# Patient Record
Sex: Female | Born: 1995 | Race: Black or African American | Hispanic: No | Marital: Single | Smoking: Never smoker
Health system: Southern US, Community
[De-identification: ages and names within clinical notes are randomized; demographics above are authoritative.]

## PROBLEM LIST (undated history)

## (undated) DIAGNOSIS — J45909 Unspecified asthma, uncomplicated: Secondary | ICD-10-CM

## (undated) DIAGNOSIS — G43019 Migraine without aura, intractable, without status migrainosus: Secondary | ICD-10-CM

## (undated) HISTORY — DX: Migraine without aura, intractable, without status migrainosus: G43.019

---

## 2015-05-01 ENCOUNTER — Emergency Department (HOSPITAL_COMMUNITY)
Admission: EM | Admit: 2015-05-01 | Discharge: 2015-05-01 | Disposition: A | Discharge status: Home / Self Care | Payer: Self-pay | Attending: Emergency Medicine | Admitting: Emergency Medicine

## 2015-05-01 ENCOUNTER — Emergency Department (HOSPITAL_COMMUNITY)

## 2015-05-01 ENCOUNTER — Encounter (HOSPITAL_COMMUNITY): Payer: Self-pay | Admitting: *Deleted

## 2015-05-01 ENCOUNTER — Encounter (HOSPITAL_COMMUNITY): Payer: Self-pay | Admitting: Emergency Medicine

## 2015-05-01 DIAGNOSIS — J45909 Unspecified asthma, uncomplicated: Secondary | ICD-10-CM

## 2015-05-01 DIAGNOSIS — J45901 Unspecified asthma with (acute) exacerbation: Secondary | ICD-10-CM | POA: Insufficient documentation

## 2015-05-01 DIAGNOSIS — R05 Cough: Secondary | ICD-10-CM

## 2015-05-01 DIAGNOSIS — R51 Headache: Secondary | ICD-10-CM | POA: Insufficient documentation

## 2015-05-01 DIAGNOSIS — R0981 Nasal congestion: Secondary | ICD-10-CM | POA: Insufficient documentation

## 2015-05-01 DIAGNOSIS — R059 Cough, unspecified: Secondary | ICD-10-CM

## 2015-05-01 DIAGNOSIS — Z79899 Other long term (current) drug therapy: Secondary | ICD-10-CM | POA: Insufficient documentation

## 2015-05-01 DIAGNOSIS — R079 Chest pain, unspecified: Secondary | ICD-10-CM | POA: Insufficient documentation

## 2015-05-01 HISTORY — DX: Unspecified asthma, uncomplicated: J45.909

## 2015-05-01 MED ORDER — IPRATROPIUM-ALBUTEROL 0.5-2.5 (3) MG/3ML IN SOLN
3.0000 mL | RESPIRATORY_TRACT | Status: DC
  Administered 2015-05-01: 3 mL via RESPIRATORY_TRACT
  Filled 2015-05-01: qty 3

## 2015-05-01 MED ORDER — PREDNISONE 20 MG PO TABS: 40.0000 mg | ORAL_TABLET | Freq: Every day | ORAL | Status: DC

## 2015-05-01 MED ORDER — DM-GUAIFENESIN ER 30-600 MG PO TB12: 1.0000 | ORAL_TABLET | Freq: Two times a day (BID) | ORAL | Status: DC

## 2015-05-01 MED ORDER — ALBUTEROL SULFATE HFA 108 (90 BASE) MCG/ACT IN AERS
2.0000 | INHALATION_SPRAY | Freq: Once | RESPIRATORY_TRACT | Status: AC
  Administered 2015-05-01: 2 via RESPIRATORY_TRACT
  Filled 2015-05-01: qty 6.7

## 2015-05-01 MED ORDER — PREDNISONE 20 MG PO TABS
60.0000 mg | ORAL_TABLET | Freq: Once | ORAL | Status: AC
  Administered 2015-05-01: 60 mg via ORAL
  Filled 2015-05-01: qty 3

## 2015-05-01 MED ORDER — ALBUTEROL SULFATE (2.5 MG/3ML) 0.083% IN NEBU
5.0000 mg | INHALATION_SOLUTION | Freq: Once | RESPIRATORY_TRACT | Status: AC
  Administered 2015-05-01: 5 mg via RESPIRATORY_TRACT
  Filled 2015-05-01: qty 6

## 2015-05-01 MED ORDER — IPRATROPIUM BROMIDE 0.02 % IN SOLN
0.5000 mg | Freq: Once | RESPIRATORY_TRACT | Status: AC
  Administered 2015-05-01: 0.5 mg via RESPIRATORY_TRACT
  Filled 2015-05-01: qty 2.5

## 2015-05-01 NOTE — ED Notes (Signed)
Pt done with breathing treatment 

## 2015-05-01 NOTE — ED Provider Notes (Signed)
CSN: [id number]     Arrival date & time 05/01/15  1002 History  This chart was scribed for non-physician practitioner, Joycie Peek, PA-C  working with Margarita Grizzle, MD, by Jarvis Morgan, ED Scribe. This patient was seen in room TR06C/TR06C and the patient's care was started at 10:27 AM.     Chief Complaint  Patient presents with  . Asthma  . Chest Pain  . Cough    The history is provided by the patient. No language interpreter was used.    HPI Comments: Jennifer Vance is a 19 y.o. female with a h/o asthma who presents to the Emergency Department complaining of intermittent, moderate, dry cough  onset 3 days. She reports associated chest tightness, centralized chest wall pain and nasal congestion. Pt has a h/o asthma and notes she had an asthma attack this morning for which she used 4 puffs of her inhaler with mild relief. Her chest pain is exacerbated with coughing. She denies any h/o PE. Pt is not currently on BC pills. She denies any hemoptysis, leg swelling, fever or chills. She states her symptoms today are consistent with her typical asthma exacerbation symptoms.  Past Medical History  Diagnosis Date  . Asthma    History reviewed. No pertinent past surgical history. No family history on file. Social History  Substance Use Topics  . Smoking status: Never Smoker   . Smokeless tobacco: None  . Alcohol Use: No   OB History    No data available     Review of Systems  Constitutional: Negative for fever and chills.  HENT: Positive for congestion.   Respiratory: Positive for cough.   Cardiovascular: Positive for chest pain (chest wall pain). Negative for leg swelling.  All other systems reviewed and are negative.     Allergies  Review of patient's allergies indicates no known allergies.  Home Medications   Prior to Admission medications   Medication Sig Start Date End Date Taking? Authorizing Provider  dextromethorphan-guaiFENesin (MUCINEX DM) 30-600 MG per 12 hr  tablet Take 1 tablet by mouth 2 (two) times daily. 05/01/15   Joycie Peek, PA-C   Triage Vitals: BP 120/74 mmHg  Pulse 76  Temp(Src) 98.2 F (36.8 C) (Oral)  Resp 16  Ht  (1.753 m)  Wt 130 lb (58.968 kg)  BMI 19.19 kg/m2  SpO2 100%  LMP 04/28/2015 (Exact Date)  Physical Exam  Constitutional: She is oriented to person, place, and time. She appears well-developed and well-nourished. No distress.  HENT:  Head: Normocephalic and atraumatic.  Eyes: Conjunctivae and EOM are normal. Pupils are equal, round, and reactive to light.  Neck: Neck supple. No tracheal deviation present.  Cardiovascular: Normal rate, regular rhythm and normal heart sounds.   Pulmonary/Chest: Effort normal. No respiratory distress. She has wheezes (mild to left upper lobe).  Musculoskeletal: Normal range of motion.  Neurological: She is alert and oriented to person, place, and time.  Skin: Skin is warm and dry.  Psychiatric: She has a normal mood and affect. Her behavior is normal.  Nursing note and vitals reviewed.   ED Course  Procedures (including critical care time)  DIAGNOSTIC STUDIES: Oxygen Saturation is 100% on RA, normal by my interpretation.    COORDINATION OF CARE: 10:39 AM- Pt advised of plan for treatment and pt agrees. Will order breathing treatment.     Labs Review Labs Reviewed - No data to display  Imaging Review No results found. I have personally reviewed and evaluated these images and lab  results as part of my medical decision-making.   EKG Interpretation None     Meds given in ED:  Medications  albuterol (PROVENTIL HFA;VENTOLIN HFA) 108 (90 BASE) MCG/ACT inhaler 2 puff (2 puffs Inhalation Given 05/01/15 1048)    Discharge Medication List as of 05/01/2015 11:33 AM    START taking these medications   Details  dextromethorphan-guaiFENesin (MUCINEX DM) 30-600 MG per 12 hr tablet Take 1 tablet by mouth 2 (two) times daily., Starting 05/01/2015, Until Discontinued, Print        Filed Vitals:   05/01/15 1012  BP: 120/74  Pulse: 76  Temp: 98.2 F (36.8 C)  TempSrc: Oral  Resp: 16  Height: 5\' 9"  (1.753 m)  Weight: 130 lb (58.968 kg)  SpO2: 100%    MDM  Vitals stable - WNL -afebrile Pt resting comfortably in ED. PE--very mild left upper lobe wheezing prior to [facility] treatment. Symptoms resolved after breathing treatment. Symptoms experienced today are similar to typical asthma exacerbation. No evidence of other acute or emergent pathology. Low suspicion for PE, pneumonia or other cardiopulmonary pathology. Patient is PERC negative. Given Mucinex for cough symptoms. Also given inhaler as patient states she is out of her one at home.  I discussed all relevant lab findings and imaging results with pt and they verbalized understanding. Discussed f/u with PCP within 48 hrs and return precautions, pt very amenable to plan.  Final diagnoses:  Asthma, unspecified asthma severity, uncomplicated  Cough   I personally performed the services described in this documentation, which was scribed in my presence. The recorded information has been reviewed and is accurate.     Joycie Peek, PA-C 05/01/15 1512  Margarita Grizzle, MD 05/02/15 [phone]

## 2015-05-01 NOTE — ED Notes (Signed)
Pt reports having recent cough and chest pain. Pain occurs more when breathing. Pt was here earlier and dc after a breathing tx. No acute distress noted at triage.

## 2015-05-01 NOTE — ED Notes (Signed)
Xray came for pickup.  Pt currently doing breathing treatment.  Will return.

## 2015-05-01 NOTE — Discharge Instructions (Signed)
1. Medications: prednisone, albuterol inhaler, mucinex usual home medications 2. Treatment: rest, drink plenty of fluids  3. Follow Up: please followup with your primary doctor for discussion of your diagnoses and further evaluation after today's visit; if you do not have a primary care doctor use the resource guide provided to find one; please return to the ER for severe chest pain or shortness of breath, fever, chills, new or worsening symptoms   Chest Pain (Nonspecific) It is often hard to give a specific diagnosis for the cause of chest pain. There is always a chance that your pain could be related to something serious, such as a heart attack or a blood clot in the lungs. You need to follow up with your health care provider for further evaluation. CAUSES   Heartburn.  Pneumonia or bronchitis.  Anxiety or stress.  Inflammation around your heart (pericarditis) or lung (pleuritis or pleurisy).  A blood clot in the lung.  A collapsed lung (pneumothorax). It can develop suddenly on its own (spontaneous pneumothorax) or from trauma to the chest.  Shingles infection (herpes zoster virus). The chest wall is composed of bones, muscles, and cartilage. Any of these can be the source of the pain.  The bones can be bruised by injury.  The muscles or cartilage can be strained by coughing or overwork.  The cartilage can be affected by inflammation and become sore (costochondritis). DIAGNOSIS  Lab tests or other studies may be needed to find the cause of your pain. Your health care provider may have you take a test called an ambulatory electrocardiogram (ECG). An ECG records your heartbeat patterns over a 24-hour period. You may also have other tests, such as:  Transthoracic echocardiogram (TTE). During echocardiography, sound waves are used to evaluate how blood flows through your heart.  Transesophageal echocardiogram (TEE).  Cardiac monitoring. This allows your health care provider to  monitor your heart rate and rhythm in real time.  Holter monitor. This is a portable device that records your heartbeat and can help diagnose heart arrhythmias. It allows your health care provider to track your heart activity for several days, if needed.  Stress tests by exercise or by giving medicine that makes the heart beat faster. TREATMENT   Treatment depends on what may be causing your chest pain. Treatment may include:  Acid blockers for heartburn.  Anti-inflammatory medicine.  Pain medicine for inflammatory conditions.  Antibiotics if an infection is present.  You may be advised to change lifestyle habits. This includes stopping smoking and avoiding alcohol, caffeine, and chocolate.  You may be advised to keep your head raised (elevated) when sleeping. This reduces the chance of acid going backward from your stomach into your esophagus. Most of the time, nonspecific chest pain will improve within 2-3 days with rest and mild pain medicine.  HOME CARE INSTRUCTIONS   If antibiotics were prescribed, take them as directed. Finish them even if you start to feel better.  For the next few days, avoid physical activities that bring on chest pain. Continue physical activities as directed.  Do not use any tobacco products, including cigarettes, chewing tobacco, or electronic cigarettes.  Avoid drinking alcohol.  Only take medicine as directed by your health care provider.  Follow your health care provider's suggestions for further testing if your chest pain does not go away.  Keep any follow-up appointments you made. If you do not go to an appointment, you could develop lasting (chronic) problems with pain. If there is any problem keeping  an appointment, call to reschedule. SEEK MEDICAL CARE IF:   Your chest pain does not go away, even after treatment.  You have a rash with blisters on your chest.  You have a fever. SEEK IMMEDIATE MEDICAL CARE IF:   You have increased chest  pain or pain that spreads to your arm, neck, jaw, back, or abdomen.  You have shortness of breath.  You have an increasing cough, or you cough up blood.  You have severe back or abdominal pain.  You feel nauseous or vomit.  You have severe weakness.  You faint.  You have chills. This is an emergency. Do not wait to see if the pain will go away. Get medical help at once. Call your local emergency services (911 in U.S.). Do not drive yourself to the hospital. MAKE SURE YOU:   Understand these instructions.  Will watch your condition.  Will get help right away if you are not doing well or get worse. Document Released: 05/25/2005 Document Revised: 08/20/2013 Document Reviewed: 03/20/2008 [facility] Patient Information 2015 [place], [place]. This information is not intended to replace advice given to you by your health care provider. Make sure you discuss any questions you have with your health care provider.  Cough, Adult  A cough is a reflex. It helps you clear your throat and airways. A cough can help heal your body. A cough can last 2 or 3 weeks (acute) or may last more than 8 weeks (chronic). Some common causes of a cough can include an infection, allergy, or a cold. HOME CARE  Only take medicine as told by your doctor.  If given, take your medicines (antibiotics) as told. Finish them even if you start to feel better.  Use a cold steam vaporizer or humidifier in your home. This can help loosen thick spit (secretions).  Sleep so you are almost sitting up (semi-upright). Use pillows to do this. This helps reduce coughing.  Rest as needed.  Stop smoking if you smoke. GET HELP RIGHT AWAY IF:  You have yellowish-white fluid (pus) in your thick spit.  Your cough gets worse.  Your medicine does not reduce coughing, and you are losing sleep.  You cough up blood.  You have trouble breathing.  Your pain gets worse and medicine does not help.  You have a fever. MAKE SURE  YOU:   Understand these instructions.  Will watch your condition.  Will get help right away if you are not doing well or get worse. Document Released: 04/28/2011 Document Revised: 12/30/2013 Document Reviewed: 04/28/2011 [facility] Patient Information 2015 [place], [place]. This information is not intended to replace advice given to you by your health care provider. Make sure you discuss any questions you have with your health care provider.  Upper Respiratory Infection, Adult An upper respiratory infection (URI) is also known as the common cold. It is often caused by a type of germ (virus). Colds are easily spread (contagious). You can pass it to others by kissing, coughing, sneezing, or drinking out of the same glass. Usually, you get better in 1 or 2 weeks.  HOME CARE   Only take medicine as told by your doctor.  Use a warm mist humidifier or breathe in steam from a hot shower.  Drink enough water and fluids to keep your pee (urine) clear or pale yellow.  Get plenty of rest.  Return to work when your temperature is back to normal or as told by your doctor. You may use a face mask and  wash your hands to stop your cold from spreading. GET HELP RIGHT AWAY IF:   After the first few days, you feel you are getting worse.  You have questions about your medicine.  You have chills, shortness of breath, or brown or red spit (mucus).  You have yellow or brown snot (nasal discharge) or pain in the face, especially when you bend forward.  You have a fever, puffy (swollen) neck, pain when you swallow, or white spots in the back of your throat.  You have a bad headache, ear pain, sinus pain, or chest pain.  You have a high-pitched whistling sound when you breathe in and out (wheezing).  You have a lasting cough or cough up blood.  You have sore muscles or a stiff neck. MAKE SURE YOU:   Understand these instructions.  Will watch your condition.  Will get help right away if you are not  doing well or get worse. Document Released: 02/01/2008 Document Revised: 11/07/2011 Document Reviewed: 11/20/2013 [facility] Patient Information 2015 [place], [place]. This information is not intended to replace advice given to you by your health care provider. Make sure you discuss any questions you have with your health care provider.   Emergency Department Resource Guide 1) Find a Doctor and Pay Out of Pocket Although you won't have to find out who is covered by your insurance plan, it is a good idea to ask around and get recommendations. You will then need to call the office and see if the doctor you have chosen will accept you as a new patient and what types of options they offer for patients who are self-pay. Some doctors offer discounts or will set up payment plans for their patients who do not have insurance, but you will need to ask so you aren't surprised when you get to your appointment.  2) Contact Your Local Health Department Not all health departments have doctors that can see patients for sick visits, but many do, so it is worth a call to see if yours does. If you don't know where your local health department is, you can check in your phone book. The CDC also has a tool to help you locate your state's health department, and many state websites also have listings of all of their local health departments.  3) Find a Walk-in Clinic If your illness is not likely to be very severe or complicated, you may want to try a walk in clinic. These are popping up all over the country in pharmacies, drugstores, and shopping centers. They're usually staffed by nurse practitioners or physician assistants that have been trained to treat common illnesses and complaints. They're usually fairly quick and inexpensive. However, if you have serious medical issues or chronic medical problems, these are probably not your best option.  No Primary Care Doctor: - Call Health Connect at  [phone] - they can help you  locate a primary care doctor that  accepts your insurance, provides certain services, etc. - Physician Referral Service- [phone]  Chronic Pain Problems: Organization         Address  Phone   Notes  [facility]  [phone] Patients need to be referred by their primary care doctor.   Medication Assistance: Organization         Address  Phone   Notes  [facility] Medication [facility] [address] [place] [place], [place] [address] [phone] --Must be a resident of [facility] -- Must have NO  insurance coverage whatsoever (no Medicaid/ Medicare, etc.) -- The pt. MUST have a primary care doctor that directs their care regularly and follows them in the community   [facility]ist  [phone]   [employer]  [phone]    Agencies that provide inexpensive medical care: Organization         Address  Phone   Notes  [facility]  [phone]   [facility]    [phone]   [facility] [address] [place], [place] [address] [phone]   [facility] [address] [place], [place] [phone]   Planned Parenthood    [phone]   [facility]    [phone]   [facility] and [facility]  [address], [place] Phone:  [phone], Fax:  [phone] Hours of Operation:  9 am - 6 pm, M-F.  Also accepts Medicaid/Medicare and self-pay.  [facility]  [address], [place] Phone: [phone], Fax: [phone]. Hours of Operation:  8:30 am - 5:30 pm, M-F.  Also accepts Medicaid and self-pay.  [facility] [place] [address], [place] Phone: [phone]   [facility] [address] Natasha Bence [place], [place] [phone], Ext. [phone] Mondays & Thursdays: 7-9 AM.  First 15 patients are seen on a first come, first serve basis.    Medicaid-accepting [facility]  Providers:  Organization         Address  Phone   Notes  [facility] [address], [place] [phone] Also accepts self-pay patients.  [facility] [address] Laurell Josephs [place], [place]  [phone]   [facility] [address], [place] [phone]   [facility] [address], [place] [phone]   Renaye Rakers [address], [place]   [phone] Only accepts [place] Access [place] patients after they have their name applied to their card.   Self-Pay (no insurance) in [facility]:  Organization         Address  Phone   Notes  Sickle Cell Patients, [facility] [address] [place], [place] [phone]   [facility] [address] [place], [place] [phone]   [facility] [place]  [address], [place] [phone]   [facility]/Dr. Osei-Bonsu  [address], [place] or [address], [place] [phone] Phone number for both [place] and [place] locations is the same.  [facility] [address], [place] [phone]   [facility] [address] [place] or [address] [phone]   [facility] [address] [place] [phone], phone; [phone], fax Sees patients 1st and 3rd Saturday of every month.  Must not qualify for public or private insurance (i.e. Medicaid, Medicare, [place] Health Choice, Veterans' Benefits)  Household income should be no more than 200% of the poverty level The clinic cannot treat you if you are pregnant or think you are pregnant  Sexually transmitted diseases are not treated at the clinic.    Dental Care: Organization         Address  Phone  Notes  [facility] Department  of Public Health [facility] [address] [place], [place] [phone] Accepts children up to age 86 who are enrolled in [place] or [place] Health Choice; pregnant women with a Medicaid card; and children who have applied for Medicaid or [place] Health Choice, but were declined, whose parents can pay a reduced fee at time of service.  [facility] of [facility]  [address], [place] [phone] Accepts children up to age 20 who are enrolled in [place] or [place] Health Choice; pregnant women with a Medicaid card; and children who have applied for Medicaid or [place] Health Choice, but were declined, whose parents can pay a reduced fee at time of service.  [facility]  [address] [place], [place] [phone] Patients are seen by appointment only. Walk-ins are not accepted. [facility] will see patients 58 years of age and older. Monday - Tuesday (8am-5pm) Most Wednesdays (8:30-5pm) $30 per visit, cash only  [facility] Adult Dental Access PROGRAM  [address], [facility] [phone] Patients are seen by appointment only. Walk-ins are not accepted. [facility] will see patients 60 years of age and older. One Wednesday Evening (Monthly: Volunteer Based).  $30 per visit, cash only  [employer]  [phone] for adults; Children under age 60, call Graduate Pediatric Dentistry at [phone]. Children aged 57-14, please call [phone] to request a pediatric application.  Dental services are provided in all areas of dental care including fillings, crowns and bridges, complete and partial dentures, implants, gum treatment, root canals, and extractions. Preventive care is also provided. Treatment is provided to both adults and children. Patients are selected via a lottery and there is often a waiting list.   [facility] [address], [place]  [phone] [email or web address]   [facility]  [address] [place], [place] [phone], Ext. [phone] Second and Fourth Thursday of each month, opens at 6:30 AM; Clinic ends at 9 AM.  Patients are seen on a first-come first-served basis, and a limited number are seen during each clinic.   [facility]  [address] Ether Griffins [place], [place] [phone]   Eligibility Requirements You must have lived in [place], [place], or [place] counties for at least the last three months.   You cannot be eligible for state or federal sponsored [place], including [employer], [place], or [employer].   You generally cannot be eligible for healthcare insurance through your employer.    How to apply: Eligibility screenings are held every Tuesday and Wednesday afternoon from 1:00 pm until 4:00 pm. You do not need an appointment for the interview!  [facility] [address] [place], [place] [phone]   [facility]  [phone]   [facility] Health Department  [phone]   [facility] Health Department  [phone]    Behavioral Health Resources in the Community: Intensive Outpatient Programs Organization         Address  Phone  Notes  [facility] [address], [place], [place] [phone]   [facility] Outpatient [address], [place], [place] [phone]   [facility] [address], [place], [place]  [phone]   [facility] [address],  [place], [place] [phone] or [phone]   Substance Abuse Resources Organization  Address  Phone  Notes  [facility]  [phone]   [facility]  [phone]   [facility]  [phone]   Floydene Flock  [phone]   Residential & Outpatient Substance Abuse Program  [phone]   Psychological Services Organization         Address  Phone  Notes  [facility]  [phone]     [facility]  [phone]   [facility] [address] [place] [phone] or [phone]    Mobile Crisis Teams Organization         Address  Phone  Notes  [facility], Mobile Crisis Care Unit  [phone]   [facility]  [address] [place], [place] [phone]   Doristine Locks [address] [place] [phone]    Self-Help/Support Groups Organization         Address  Phone             Notes  Mental Health Assoc. of  - variety of support groups  336- [phone] Call for more information  Narcotics Anonymous (NA), [facility] [address], [employer] [place]  2 meetings at this location   Statistician         Address  Phone  Notes  [facility] [address],    [place]  [phone]   [facility]  [address], [place] [address], [place], [place] [phone]   [facility] [address] [place], [place] [phone] Admissions: 8am-3pm M-F  [facility] [address],    [place], [place] [phone]   [facility] [address] [place], [place], [place] [phone]   [facility] [address],  [place], [place] [phone]   [facility] - Intensive Outpatient [address] Laurell Joseph[address], [place], [place] [phone]   [facility] ([facility] [address] [place]  [place], [place] [phone] or [phone]   [facility] ([facility]) [address], [place], [place] [phone] Accepts Medicaid  [facility] [address],  [place] [phone] Substance Abuse/Addiction Treatment   [facility] Organization         Address  Phone  Notes  [facility]  [phone]   Angie Fava, PhD [address] Ervin Knack [place], [place]   [phone] or [phone]    [facility]   [address] [place], [place] [phone]   [facility] [address], [place], [place] [phone] Insurance/Medicaid/sponsorship through [facility] and Families [address]                                    [place], [place] [phone] Therapy/tele-psych/case  [facility] [address], [place] [phone]    Dr. Lolly Mustache  [phone]   [facility] [place]  [employer] [facility] Dept. 1) [address] [place] 2) [address], [place] 3)  [address], [place] [phone]  [phone]   [facility] Child Abuse Hotline [phone])[phone] or 5806633387 (After Hours)

## 2015-05-01 NOTE — ED Notes (Signed)
Pt stable, ambulatory, states understanding of discharge instructions 

## 2015-05-01 NOTE — ED Notes (Signed)
C/o cough, chest pain x 2-3 days. States cough is productive at night, green sputum.

## 2015-05-01 NOTE — ED Notes (Signed)
Treatment complete.

## 2015-05-01 NOTE — Discharge Instructions (Signed)
Please follow-up with your doctors as needed. Return to ED for worsening symptoms.  Bronchospasm A bronchospasm is a spasm or tightening of the airways going into the lungs. During a bronchospasm breathing becomes more difficult because the airways get smaller. When this happens there can be coughing, a whistling sound when breathing (wheezing), and difficulty breathing. Bronchospasm is often associated with asthma, but not all patients who experience a bronchospasm have asthma. CAUSES  A bronchospasm is caused by inflammation or irritation of the airways. The inflammation or irritation may be triggered by:   Allergies (such as to animals, pollen, food, or mold). Allergens that cause bronchospasm may cause wheezing immediately after exposure or many hours later.   Infection. Viral infections are believed to be the most common cause of bronchospasm.   Exercise.   Irritants (such as pollution, cigarette smoke, strong odors, aerosol sprays, and paint fumes).   Weather changes. Winds increase molds and pollens in the air. Rain refreshes the air by washing irritants out. Cold air may cause inflammation.   Stress and emotional upset.  SIGNS AND SYMPTOMS   Wheezing.   Excessive nighttime coughing.   Frequent or severe coughing with a simple cold.   Chest tightness.   Shortness of breath.  DIAGNOSIS  Bronchospasm is usually diagnosed through a history and physical exam. Tests, such as chest X-rays, are sometimes done to look for other conditions. TREATMENT   Inhaled medicines can be given to open up your airways and help you breathe. The medicines can be given using either an inhaler or a nebulizer machine.  Corticosteroid medicines may be given for severe bronchospasm, usually when it is associated with asthma. HOME CARE INSTRUCTIONS   Always have a plan prepared for seeking medical care. Know when to call your health care provider and local emergency services (911 in the  [place] Know where you can access local emergency care.  Only take medicines as directed by your health care provider.  If you were prescribed an inhaler or nebulizer machine, ask your health care provider to explain how to use it correctly. Always use a spacer with your inhaler if you were given one.  It is necessary to remain calm during an attack. Try to relax and breathe more slowly.  Control your home environment in the following ways:   Change your heating and air conditioning filter at least once a month.   Limit your use of fireplaces and wood stoves.  Do not smoke and do not allow smoking in your home.   Avoid exposure to perfumes and fragrances.   Get rid of pests (such as roaches and mice) and their droppings.   Throw away plants if you see mold on them.   Keep your house clean and dust free.   Replace carpet with wood, tile, or vinyl flooring. Carpet can trap dander and dust.   Use allergy-proof pillows, mattress covers, and box spring covers.   Wash bed sheets and blankets every week in hot water and dry them in a dryer.   Use blankets that are made of polyester or cotton.   Wash hands frequently. SEEK MEDICAL CARE IF:   You have muscle aches.   You have chest pain.   The sputum changes from clear or white to yellow, green, gray, or bloody.   The sputum you cough up gets thicker.   There are problems that may be related to the medicine you are given, such as a rash, itching, swelling, or trouble breathing.  SEEK IMMEDIATE MEDICAL CARE IF:   You have worsening wheezing and coughing even after taking your prescribed medicines.   You have increased difficulty breathing.   You develop severe chest pain. MAKE SURE YOU:   Understand these instructions.  Will watch your condition.  Will get help right away if you are not doing well or get worse. Document Released: 08/18/2003 Document Revised: 08/20/2013 Document Reviewed:  02/04/2013 [facility] Patient Information 2015 [place], [place]. This information is not intended to replace advice given to you by your health care provider. Make sure you discuss any questions you have with your health care provider.  Cough, Adult  A cough is a reflex that helps clear your throat and airways. It can help heal the body or may be a reaction to an irritated airway. A cough may only last 2 or 3 weeks (acute) or may last more than 8 weeks (chronic).  CAUSES Acute cough:  Viral or bacterial infections. Chronic cough:  Infections.  Allergies.  Asthma.  Post-nasal drip.  Smoking.  Heartburn or acid reflux.  Some medicines.  Chronic lung problems (COPD).  Cancer. SYMPTOMS   Cough.  Fever.  Chest pain.  Increased breathing rate.  High-pitched whistling sound when breathing (wheezing).  Colored mucus that you cough up (sputum). TREATMENT   A bacterial cough may be treated with antibiotic medicine.  A viral cough must run its course and will not respond to antibiotics.  Your caregiver may recommend other treatments if you have a chronic cough. HOME CARE INSTRUCTIONS   Only take over-the-counter or prescription medicines for pain, discomfort, or fever as directed by your caregiver. Use cough suppressants only as directed by your caregiver.  Use a cold steam vaporizer or humidifier in your bedroom or home to help loosen secretions.  Sleep in a semi-upright position if your cough is worse at night.  Rest as needed.  Stop smoking if you smoke. SEEK IMMEDIATE MEDICAL CARE IF:   You have pus in your sputum.  Your cough starts to worsen.  You cannot control your cough with suppressants and are losing sleep.  You begin coughing up blood.  You have difficulty breathing.  You develop pain which is getting worse or is uncontrolled with medicine.  You have a fever. MAKE SURE YOU:   Understand these instructions.  Will watch your condition.  Will  get help right away if you are not doing well or get worse. Document Released: 02/11/2011 Document Revised: 11/07/2011 Document Reviewed: 02/11/2011 [facility] Patient Information 2015 [place], [place]. This information is not intended to replace advice given to you by your health care provider. Make sure you discuss any questions you have with your health care provider.

## 2015-05-01 NOTE — ED Provider Notes (Signed)
CSN: [id number]     Arrival date & time 05/01/15  4098 History  This chart was scribed for non-physician practitioner, Glean Hess, PA-C working with Bethann Berkshire, MD by Gwenyth Ober, ED scribe. This patient was seen in room TR08C/TR08C and the patient's care was started at 7:26 PM   Chief Complaint  Patient presents with  . Asthma   The history is provided by the patient. No language interpreter was used.    HPI Comments: Jennifer Vance is a 19 y.o. female with a history of asthma who presents to the Emergency Department complaining of constant, mild, aching, and tight chest pain that started 2-3 days ago. She reports intermittent HA, fatigue, congestion, rhinorrhea, sneezing, SOB, wheezing, sore throat, and cough productive of yellow sputum that started last night. Pt notes that SOB occurs with exertion and improves with "slowing down." Her chest pain is exacerbated with coughing. Pt states current pain is similar to prior asthma exacerbations. Pt was seen in the ED earlier today with URI symptoms and had a breathing treatment. She was prescribed Mucinex, but has not tried this yet. She denies recent sick contact. Pt also denies hemoptysis, itchy or watery eyes, fever, chills, light-headedness, abdominal pain, nausea, vomiting, diarrhea, constipation, dysuria and urinary frequency as associated symptoms.  Past Medical History  Diagnosis Date  . Asthma    History reviewed. No pertinent past surgical history. History reviewed. No pertinent family history. Social History  Substance Use Topics  . Smoking status: Never Smoker   . Smokeless tobacco: None  . Alcohol Use: No   OB History    No data available     Review of Systems  Constitutional: Positive for fatigue. Negative for fever, chills, activity change and appetite change.  HENT: Positive for congestion, rhinorrhea, sneezing and sore throat.   Eyes: Negative for pain, discharge and itching.  Respiratory: Positive for cough,  chest tightness, shortness of breath and wheezing.   Cardiovascular: Positive for chest pain.  Gastrointestinal: Negative for nausea, vomiting, abdominal pain, diarrhea and constipation.  Genitourinary: Negative for dysuria, urgency and frequency.  Musculoskeletal: Negative for myalgias, back pain, arthralgias, neck pain and neck stiffness.  Skin: Negative for color change, pallor, rash and wound.  Neurological: Positive for headaches. Negative for dizziness, weakness, light-headedness and numbness.  All other systems reviewed and are negative.  Allergies  Review of patient's allergies indicates no known allergies.  Home Medications   Prior to Admission medications   Medication Sig Start Date End Date Taking? Authorizing Provider  dextromethorphan-guaiFENesin (MUCINEX DM) 30-600 MG per 12 hr tablet Take 1 tablet by mouth 2 (two) times daily. 05/01/15   Benjamin Cartner, PA-C   BP 120/76 mmHg  Pulse 72  Temp(Src) 98.5 F (36.9 C) (Oral)  Resp 16  Ht  (1.753 m)  Wt 130 lb (58.968 kg)  BMI 19.19 kg/m2  SpO2 98%  LMP 04/28/2015 (Exact Date) Physical Exam  Constitutional: She is oriented to person, place, and time. She appears well-developed and well-nourished. No distress.  HENT:  Head: Normocephalic and atraumatic.  Right Ear: Tympanic membrane, external ear and ear canal normal.  Left Ear: Tympanic membrane, external ear and ear canal normal.  Nose: Nose normal.  Mouth/Throat: Uvula is midline and mucous membranes are normal. Posterior oropharyngeal erythema present. No oropharyngeal exudate.  Erythema to posterior oropharynx.  Eyes: Conjunctivae, EOM and lids are normal. Pupils are equal, round, and reactive to light. Right eye exhibits no discharge. Left eye exhibits no discharge. No scleral  icterus.  Neck: Normal range of motion. Neck supple.  Cardiovascular: Normal rate, regular rhythm, normal heart sounds, intact distal pulses and normal pulses.   Pulmonary/Chest: Effort  normal and breath sounds normal. No accessory muscle usage. No respiratory distress. She has no decreased breath sounds. She has no wheezes. She has no rales. She exhibits tenderness.  Mild TTP of left side of anterior chest wall.  Abdominal: Soft. Normal appearance and bowel sounds are normal. She exhibits no distension and no mass. There is no tenderness. There is no rigidity, no rebound and no guarding.  Musculoskeletal: Normal range of motion. She exhibits no edema or tenderness.  Lymphadenopathy:    She has no cervical adenopathy.  Neurological: She is alert and oriented to person, place, and time. She has normal strength. No sensory deficit.  Skin: Skin is warm, dry and intact. No rash noted. She is not diaphoretic. No erythema. No pallor.  Psychiatric: She has a normal mood and affect. Her speech is normal and behavior is normal. Judgment and thought content normal.  Nursing note and vitals reviewed.   ED Course  Procedures   DIAGNOSTIC STUDIES: Oxygen Saturation is 98% on RA, normal by my interpretation.    COORDINATION OF CARE: 7:34 PM Discussed treatment plan with pt at bedside and pt agreed to plan.   Labs Review Labs Reviewed - No data to display  Imaging Review Dg Chest 2 View  05/01/2015   CLINICAL DATA:  18 year old female this shortness breath and central chest pain.  EXAM: CHEST  2 VIEW  COMPARISON:  No priors.  FINDINGS: Lung volumes are normal. No consolidative airspace disease. No pleural effusions. No pneumothorax. No pulmonary nodule or mass noted. Pulmonary vasculature and the cardiomediastinal silhouette are within normal limits. Bilateral nipple rings incidentally noted.  IMPRESSION: No radiographic evidence of acute cardiopulmonary disease.   Electronically Signed   By: Trudie Reed M.D.   On: 05/01/2015 20:23     Glean Hess, PA-C has personally reviewed and evaluated these images as part of the medical decision-making.   EKG Interpretation None       MDM   Final diagnoses:  Cough  Nasal congestion  Chest pain, unspecified chest pain type   19 year old female presents with chest pain and URI symptoms. Was seen earlier in the ED for the same symptoms.  Given breathing treatment and discharged with mucinex for symptom relief. States she went home an did not feel better, so came back to the ED.  Patient is afebrile. Vitals stable. O2 sat 98% on RA. Erythema to posterior oropharynx. No exudate. Lungs clear to auscultation bilaterally. No wheezing. No respiratory distress. Mild TTP of left side of anterior chest wall. No lower extremity edema.  PERC negative. Chest pain reproducible on exam. Patient reports shortness of breath when she is busy at work, which is relieved when she slows down and rests. No history of DVT, history of cancer, tobacco use. Low suspicion for PE.  Will treat with albuterol/atrovent nebulizer and prednisone in the ED. Will obtain chest x-ray to further evaluate symptoms.  CXR negative for acute cardiopulmonary disease. No evidence of pneumonia or pneumothorax.  On re-check of patient, she reports significant symptom improvement s/p nebulizer. Will treat with short course of prednisone. Patient to use home albuterol inhaler as needed and take prescribed mucinex. Patient to follow up with PCP. Return precautions discussed.   BP 120/76 mmHg  Pulse 72  Temp(Src) 98.5 F (36.9 C) (Oral)  Resp 16  Ht 5\' 9"  (1.753 m)  Wt 130 lb (58.968 kg)  BMI 19.19 kg/m2  SpO2 98%  LMP 04/28/2015 (Exact Date)   I personally performed the services described in this documentation, which was scribed in my presence. The recorded information has been reviewed and is accurate.    Mady Gemma, PA-C 05/01/15 2112  Bethann Berkshire, MD 05/01/15 [phone]

## 2015-09-25 ENCOUNTER — Emergency Department (HOSPITAL_COMMUNITY)
Admission: EM | Admit: 2015-09-25 | Discharge: 2015-09-25 | Disposition: A | Discharge status: Home / Self Care | Payer: Federal, State, Local not specified - PPO | Attending: Emergency Medicine | Admitting: Emergency Medicine

## 2015-09-25 ENCOUNTER — Encounter (HOSPITAL_COMMUNITY): Payer: Self-pay | Admitting: Nurse Practitioner

## 2015-09-25 DIAGNOSIS — H53149 Visual discomfort, unspecified: Secondary | ICD-10-CM | POA: Diagnosis not present

## 2015-09-25 DIAGNOSIS — Z7952 Long term (current) use of systemic steroids: Secondary | ICD-10-CM | POA: Diagnosis not present

## 2015-09-25 DIAGNOSIS — R519 Headache, unspecified: Secondary | ICD-10-CM

## 2015-09-25 DIAGNOSIS — J45909 Unspecified asthma, uncomplicated: Secondary | ICD-10-CM | POA: Insufficient documentation

## 2015-09-25 DIAGNOSIS — Z79899 Other long term (current) drug therapy: Secondary | ICD-10-CM | POA: Insufficient documentation

## 2015-09-25 DIAGNOSIS — R51 Headache: Secondary | ICD-10-CM | POA: Diagnosis not present

## 2015-09-25 DIAGNOSIS — R040 Epistaxis: Secondary | ICD-10-CM | POA: Diagnosis not present

## 2015-09-25 MED ORDER — METOCLOPRAMIDE HCL 5 MG/ML IJ SOLN
10.0000 mg | Freq: Once | INTRAMUSCULAR | Status: AC
  Administered 2015-09-25: 10 mg via INTRAVENOUS
  Filled 2015-09-25: qty 2

## 2015-09-25 MED ORDER — SODIUM CHLORIDE 0.9 % IV BOLUS (SEPSIS)
1000.0000 mL | Freq: Once | INTRAVENOUS | Status: AC
Start: 2015-09-25 — End: 2015-09-25
  Administered 2015-09-25: 1000 mL via INTRAVENOUS

## 2015-09-25 MED ORDER — KETOROLAC TROMETHAMINE 30 MG/ML IJ SOLN
30.0000 mg | Freq: Once | INTRAMUSCULAR | Status: AC
  Administered 2015-09-25: 30 mg via INTRAVENOUS
  Filled 2015-09-25: qty 1

## 2015-09-25 MED ORDER — DIPHENHYDRAMINE HCL 50 MG/ML IJ SOLN
25.0000 mg | Freq: Once | INTRAMUSCULAR | Status: AC
  Administered 2015-09-25: 25 mg via INTRAVENOUS
  Filled 2015-09-25: qty 1

## 2015-09-25 NOTE — ED Notes (Signed)
Pt endorses intermittent headaches and nosebleeds. Patient notes when headache is worse nose bleeds typically starts in bilateral nostrils. Patient sts this headache has lasted 2 days and has minimal relief with advil. Patient denies nausea endorses photosensitivity with severe headache but denies at this time. Patient alert and oriented. No neuro deficts noted. No active bleeding, or dry blood in nares noted.

## 2015-09-25 NOTE — Discharge Instructions (Signed)
Ms. Jennifer Vance,  Nice meeting you! Please follow-up with a neurologist. Return to the emergency department if you develop increased number of or worsening headaches, are unable to stop your nosebleeds, have slurred speech, weakness. Feel better soon!  S. Lane Hacker, PA-C   General Headache Without Cause A headache is pain or discomfort felt around the head or neck area. There are many causes and types of headaches. In some cases, the cause may not be found.  HOME CARE  Managing Pain  Take over-the-counter and prescription medicines only as told by your doctor.  Lie down in a dark, quiet room when you have a headache.  If directed, apply ice to the head and neck area:  Put ice in a plastic bag.  Place a towel between your skin and the bag.  Leave the ice on for 20 minutes, 2-3 times per day.  Use a heating pad or hot shower to apply heat to the head and neck area as told by your doctor.  Keep lights dim if bright lights bother you or make your headaches worse. Eating and Drinking  Eat meals on a regular schedule.  Lessen how much alcohol you drink.  Lessen how much caffeine you drink, or stop drinking caffeine. General Instructions  Keep all follow-up visits as told by your doctor. This is important.  Keep a journal to find out if certain things bring on headaches. For example, write down:  What you eat and drink.  How much sleep you get.  Any change to your diet or medicines.  Relax by getting a massage or doing other relaxing activities.  Lessen stress.  Sit up straight. Do not tighten (tense) your muscles.  Do not use tobacco products. This includes cigarettes, chewing tobacco, or e-cigarettes. If you need help quitting, ask your doctor.  Exercise regularly as told by your doctor.  Get enough sleep. This often means 7-9 hours of sleep. GET HELP IF:  Your symptoms are not helped by medicine.  You have a headache that feels different than the other  headaches.  You feel sick to your stomach (nauseous) or you throw up (vomit).  You have a fever. GET HELP RIGHT AWAY IF:   Your headache becomes really bad.  You keep throwing up.  You have a stiff neck.  You have trouble seeing.  You have trouble speaking.  You have pain in the eye or ear.  Your muscles are weak or you lose muscle control.  You lose your balance or have trouble walking.  You feel like you will pass out (faint) or you pass out.  You have confusion.   This information is not intended to replace advice given to you by your health care provider. Make sure you discuss any questions you have with your health care provider.   Document Released: 05/24/2008 Document Revised: 05/06/2015 Document Reviewed: 12/08/2014 Elsevier Interactive Patient Education [employer]

## 2015-09-25 NOTE — ED Provider Notes (Signed)
CSN: [id number]     Arrival date & time 09/25/15  1038 History   First MD Initiated Contact with Patient 09/25/15 1041     Chief Complaint  Patient presents with  . Headache  . Epistaxis   HPI   Jennifer Vance is a 20 y.o. F PMH significant for asthma presenting with a 2 day history of worsening headache. She describes her headache as 8/10 pain scale, throbbing, right temporal, non-radiating, intermittent. She endorses photophobia and epistaxis (one episode last night BL; bleeding controlled at home). She denies fevers, chills, LOC, seizures, CP, SOB, loss of bladder/bowel control, neck stiffness, recent URI.   Past Medical History  Diagnosis Date  . Asthma    History reviewed. No pertinent past surgical history. No family history on file. Social History  Substance Use Topics  . Smoking status: Never Smoker   . Smokeless tobacco: None  . Alcohol Use: No   OB History    No data available     Review of Systems  Ten systems are reviewed and are negative for acute change except as noted in the HPI  Allergies  Review of patient's allergies indicates no known allergies.  Home Medications   Prior to Admission medications   Medication Sig Start Date End Date Taking? Authorizing Provider  dextromethorphan-guaiFENesin (MUCINEX DM) 30-600 MG per 12 hr tablet Take 1 tablet by mouth 2 (two) times daily. 05/01/15   Joycie Peek, PA-C  predniSONE (DELTASONE) 20 MG tablet Take 2 tablets (40 mg total) by mouth daily. 05/01/15   Mady Gemma, PA-C   BP 109/71 mmHg  Pulse 73  Temp(Src) 98.4 F (36.9 C) (Oral)  Resp 16  Ht 5' 8.5" (1.74 m)  Wt 58.968 kg  BMI 19.48 kg/m2  SpO2 100%  LMP 09/24/2015 Physical Exam  Constitutional: She is oriented to person, place, and time. She appears well-developed and well-nourished. No distress.  HENT:  Head: Normocephalic and atraumatic.  Right Ear: External ear normal.  Left Ear: External ear normal.  Nose: Nose normal.  Mouth/Throat:  Oropharynx is clear and moist. No oropharyngeal exudate.  Eyes: Conjunctivae are normal. Pupils are equal, round, and reactive to light. Right eye exhibits no discharge. Left eye exhibits no discharge. No scleral icterus.  Neck: Normal range of motion. Neck supple. No tracheal deviation present.  Cardiovascular: Normal rate, regular rhythm, normal heart sounds and intact distal pulses.  Exam reveals no gallop and no friction rub.   No murmur heard. Pulmonary/Chest: Effort normal and breath sounds normal. No respiratory distress. She has no wheezes. She has no rales. She exhibits no tenderness.  Abdominal: Soft. Bowel sounds are normal. She exhibits no distension and no mass. There is no tenderness. There is no rebound and no guarding.  Musculoskeletal: Normal range of motion. She exhibits no edema or tenderness.  Lymphadenopathy:    She has no cervical adenopathy.  Neurological: She is alert and oriented to person, place, and time. No cranial nerve deficit. Coordination normal.  Skin: Skin is warm and dry. No rash noted. She is not diaphoretic. No erythema.  Psychiatric: She has a normal mood and affect. Her behavior is normal.  Nursing note and vitals reviewed.   ED Course  Procedures   MDM   Final diagnoses:  Headache, unspecified headache type   Patient non-toxic appearing and VSS. Most likely primary HA. Less likely hypertensive HA, venous sinus thrombosis, aneurysmal SAH, AVM, acute CVA, preeclampsia, carotid dissection, ICH, SAH, SDH, EDH, postconcussive syndrome, meningitis, encephalitis, abscess,  mass effect, hydrocephalus, pseudotumor cerebri, glaucoma, trigeminal neuralgia, sinusitis, TMJ syndrome, temporal arteritis, mastoiditis, carbon monoxide poisoning, rebound HA based on patient history and physical exam.   Medications  sodium chloride 0.9 % bolus 1,000 mL (0 mLs Intravenous Stopped 09/25/15 1321)  metoCLOPramide (REGLAN) injection 10 mg (10 mg Intravenous Given 09/25/15  1141)  diphenhydrAMINE (BENADRYL) injection 25 mg (25 mg Intravenous Given 09/25/15 1140)  ketorolac (TORADOL) 30 MG/ML injection 30 mg (30 mg Intravenous Given 09/25/15 1141)   Patient feels improved after observation and/or treatment in ED.  Patient may be safely discharged home. Discussed reasons for return. Patient to follow-up with primary care provider within one week or neurology as needed. Patient in understanding and agreement with the plan.   Melton Krebs, PA-C 09/27/15 2317  Rolland Porter, MD 10/03/15 [phone]

## 2015-10-06 ENCOUNTER — Emergency Department (HOSPITAL_COMMUNITY)
Admission: EM | Admit: 2015-10-06 | Discharge: 2015-10-06 | Disposition: A | Discharge status: Home / Self Care | Payer: Federal, State, Local not specified - PPO | Source: Home / Self Care | Attending: Family Medicine | Admitting: Family Medicine

## 2015-10-06 ENCOUNTER — Encounter (HOSPITAL_COMMUNITY): Payer: Self-pay | Admitting: Emergency Medicine

## 2015-10-06 DIAGNOSIS — G43009 Migraine without aura, not intractable, without status migrainosus: Secondary | ICD-10-CM

## 2015-10-06 MED ORDER — ONDANSETRON 4 MG PO TBDP
4.0000 mg | ORAL_TABLET | Freq: Once | ORAL | Status: AC
  Administered 2015-10-06: 4 mg via ORAL

## 2015-10-06 MED ORDER — KETOROLAC TROMETHAMINE 30 MG/ML IJ SOLN
INTRAMUSCULAR | Status: AC
Start: 2015-10-06 — End: 2015-10-06
  Filled 2015-10-06: qty 1

## 2015-10-06 MED ORDER — RIZATRIPTAN BENZOATE 10 MG PO TBDP: 10.0000 mg | ORAL_TABLET | ORAL | Status: DC | PRN

## 2015-10-06 MED ORDER — KETOROLAC TROMETHAMINE 30 MG/ML IJ SOLN
30.0000 mg | Freq: Once | INTRAMUSCULAR | Status: AC
  Administered 2015-10-06: 30 mg via INTRAMUSCULAR

## 2015-10-06 MED ORDER — ONDANSETRON 4 MG PO TBDP
ORAL_TABLET | ORAL | Status: AC
  Filled 2015-10-06: qty 1

## 2015-10-06 MED ORDER — ONDANSETRON HCL 4 MG PO TABS: 4.0000 mg | ORAL_TABLET | Freq: Four times a day (QID) | ORAL | Status: DC

## 2015-10-06 NOTE — Discharge Instructions (Signed)

## 2015-10-06 NOTE — ED Provider Notes (Signed)
CSN: [id number]     Arrival date & time 10/06/15  1928 History   First MD Initiated Contact with Patient 10/06/15 2024     Chief Complaint  Patient presents with  . Abdominal Pain  . Chest Pain   (Consider location/radiation/quality/duration/timing/severity/associated sxs/prior Treatment) HPI 20 y/o female with ongoing headache. Was seen in ED given headache cocktail with improvement of symptoms.  Today episode of vomiting, no relief of headache with any OTC meds. Aslo feels nauseated Past Medical History  Diagnosis Date  . Asthma    History reviewed. No pertinent past surgical history. No family history on file. Social History  Substance Use Topics  . Smoking status: Never Smoker   . Smokeless tobacco: None  . Alcohol Use: No   OB History    No data available     Review of Systems ROS +'ve Headache and nausea Denies:  ABDOMINAL PAIN, CHEST PAIN, CONGESTION, DYSURIA, SHORTNESS OF BREATH  Allergies  Other and Strawberry flavor  Home Medications   Prior to Admission medications   Medication Sig Start Date End Date Taking? Authorizing Provider  albuterol (PROVENTIL HFA;VENTOLIN HFA) 108 (90 Base) MCG/ACT inhaler Inhale 2 puffs into the lungs every 6 (six) hours as needed for wheezing or shortness of breath.   Yes Historical Provider, MD  dextromethorphan-guaiFENesin (MUCINEX DM) 30-600 MG per 12 hr tablet Take 1 tablet by mouth 2 (two) times daily. Patient not taking: Reported on 09/25/2015 05/01/15   Joycie Peek, PA-C   Meds Ordered and Administered this Visit   Medications  ketorolac (TORADOL) 30 MG/ML injection 30 mg (30 mg Intramuscular Given 10/06/15 2049)  ondansetron (ZOFRAN-ODT) disintegrating tablet 4 mg (4 mg Oral Given 10/06/15 2049)    BP 111/71 mmHg  Pulse 85  Temp(Src) 98.7 F (37.1 C) (Oral)  Resp 16  SpO2 100%  LMP 09/24/2015 No data found.   Physical Exam NURSES NOTES AND VITAL SIGNS REVIEWED. CONSTITUTIONAL: Well developed, well nourished, no  acute distress HEENT: normocephalic, atraumatic, right and left TM's are normal EYES: Conjunctiva normal NECK:normal ROM, supple, no adenopathy PULMONARY:No respiratory distress, normal effort, Lungs: CTAb/l, no wheezes, or increased work of breathing CARDIOVASCULAR: RRR, no murmur ABDOMEN: soft, ND, NT, +'ve BS,  MUSCULOSKELETAL: Normal ROM of all extremities,  SKIN: warm and dry without rash PSYCHIATRIC: Mood and affect, behavior are normal  ED Course  Procedures (including critical care time)  Labs Review Labs Reviewed - No data to display  Imaging Review No results found.   Visual Acuity Review  Right Eye Distance:   Left Eye Distance:   Bilateral Distance:    Right Eye Near:   Left Eye Near:    Bilateral Near:        Treated with toradol and zofran. States she is feeling a bit better.  MDM   1. Migraine without aura and without status migrainosus, not intractable    Patient is advised to continue home symptomatic treatment. Prescription for maxalt and zofran  sent pharmacy patient has indicated. Patient is advised that if there are new or worsening symptoms or attend the emergency department, or contact primary care provider. Instructions of care provided discharged home in stable condition. Return to work/school note provided.  THIS NOTE WAS GENERATED USING A VOICE RECOGNITION SOFTWARE PROGRAM. ALL REASONABLE EFFORTS  WERE MADE TO PROOFREAD THIS DOCUMENT FOR ACCURACY.   Tharon Aquas, PA 10/06/15 2222

## 2015-10-06 NOTE — ED Notes (Addendum)
C/o abd pain onset 3 days associated w/HA, nausea, emesis, diarrhea Also c/o intermittent CP that increases w/activity and deep breaths Seen at [facility] ED last week for similar sx A&O x4... No acute distress.

## 2015-10-13 ENCOUNTER — Encounter: Payer: Self-pay | Admitting: Neurology

## 2015-10-13 ENCOUNTER — Ambulatory Visit: Payer: Federal, State, Local not specified - PPO | Admitting: Neurology

## 2015-10-13 ENCOUNTER — Ambulatory Visit (INDEPENDENT_AMBULATORY_CARE_PROVIDER_SITE_OTHER): Payer: Federal, State, Local not specified - PPO | Admitting: Neurology

## 2015-10-13 VITALS — BP 117/77 | HR 73 | Ht 69.5 in | Wt 112.5 lb

## 2015-10-13 DIAGNOSIS — G43019 Migraine without aura, intractable, without status migrainosus: Secondary | ICD-10-CM

## 2015-10-13 HISTORY — DX: Migraine without aura, intractable, without status migrainosus: G43.019

## 2015-10-13 MED ORDER — TOPIRAMATE 25 MG PO TABS: ORAL_TABLET | ORAL | Status: DC

## 2015-10-13 MED ORDER — METOCLOPRAMIDE HCL 5 MG PO TABS: 5.0000 mg | ORAL_TABLET | Freq: Four times a day (QID) | ORAL | Status: DC | PRN

## 2015-10-13 MED ORDER — PREDNISONE 5 MG PO TABS: ORAL_TABLET | ORAL | Status: DC

## 2015-10-13 MED ORDER — KETOROLAC TROMETHAMINE 10 MG PO TABS: 10.0000 mg | ORAL_TABLET | Freq: Three times a day (TID) | ORAL | Status: AC | PRN

## 2015-10-13 NOTE — Patient Instructions (Signed)
We will start on a prednisone dose pack to get rid of the headache and use Topamax to improve the headache frequency. Use ketorolac 10 mg tablets if needed, do not use while on the prednisone.  Topamax (topiramate) is a seizure medication that has an FDA approval for seizures and for migraine headache. Potential side effects of this medication include weight loss, cognitive slowing, tingling in the fingers and toes, and carbonated drinks will taste bad. If any significant side effects are noted on this drug, please contact our office.  Migraine Headache A migraine headache is an intense, throbbing pain on one or both sides of your head. A migraine can last for 30 minutes to several hours. CAUSES  The exact cause of a migraine headache is not always known. However, a migraine may be caused when nerves in the brain become irritated and release chemicals that cause inflammation. This causes pain. Certain things may also trigger migraines, such as:  Alcohol.  Smoking.  Stress.  Menstruation.  Aged cheeses.  Foods or drinks that contain nitrates, glutamate, aspartame, or tyramine.  Lack of sleep.  Chocolate.  Caffeine.  Hunger.  Physical exertion.  Fatigue.  Medicines used to treat chest pain (nitroglycerine), birth control pills, estrogen, and some blood pressure medicines. SIGNS AND SYMPTOMS  Pain on one or both sides of your head.  Pulsating or throbbing pain.  Severe pain that prevents daily activities.  Pain that is aggravated by any physical activity.  Nausea, vomiting, or both.  Dizziness.  Pain with exposure to bright lights, loud noises, or activity.  General sensitivity to bright lights, loud noises, or smells. Before you get a migraine, you may get warning signs that a migraine is coming (aura). An aura may include:  Seeing flashing lights.  Seeing bright spots, halos, or zigzag lines.  Having tunnel vision or blurred vision.  Having feelings of  numbness or tingling.  Having trouble talking.  Having muscle weakness. DIAGNOSIS  A migraine headache is often diagnosed based on:  Symptoms.  Physical exam.  A CT scan or MRI of your head. These imaging tests cannot diagnose migraines, but they can help rule out other causes of headaches. TREATMENT Medicines may be given for pain and nausea. Medicines can also be given to help prevent recurrent migraines.  HOME CARE INSTRUCTIONS  Only take over-the-counter or prescription medicines for pain or discomfort as directed by your health care provider. The use of long-term narcotics is not recommended.  Lie down in a dark, quiet room when you have a migraine.  Keep a journal to find out what may trigger your migraine headaches. For example, write down:  What you eat and drink.  How much sleep you get.  Any change to your diet or medicines.  Limit alcohol consumption.  Quit smoking if you smoke.  Get 7-9 hours of sleep, or as recommended by your health care provider.  Limit stress.  Keep lights dim if bright lights bother you and make your migraines worse. SEEK IMMEDIATE MEDICAL CARE IF:   Your migraine becomes severe.  You have a fever.  You have a stiff neck.  You have vision loss.  You have muscular weakness or loss of muscle control.  You start losing your balance or have trouble walking.  You feel faint or pass out.  You have severe symptoms that are different from your first symptoms. MAKE SURE YOU:   Understand these instructions.  Will watch your condition.  Will get help right away if  you are not doing well or get worse.   This information is not intended to replace advice given to you by your health care provider. Make sure you discuss any questions you have with your health care provider.   Document Released: 08/15/2005 Document Revised: 09/05/2014 Document Reviewed: 04/22/2013 Elsevier Interactive Patient Education [employer]

## 2015-10-13 NOTE — Progress Notes (Signed)
Reason for visit: Migraine headache  Referring physician: Barry  Jennifer Vance is a 20 y.o. female  History of present illness:  Jennifer Vance is a 20 year old right-handed black female with a history of regular headaches since she was 20 years old. The patient reports that she usually will have one headache every 2 weeks, but 4 weeks ago, her headaches converted to being daily in nature, and they are preventing her from working regularly, she has not worked in about 10 days. The patient has been to the emergency room on 10/06/2015, they gave her a "migraine cocktail" which helped her headache for about 4 hours, but then the headache returned. The headache is always present, generally worse in the morning. She indicates that the headaches are primarily right-sided in nature in the right frontotemporal region, but may spread back to the occipital area without neck stiffness. The patient may have nausea and vomiting, some blurring of vision and some spots in front of the eyes. The patient reports no numbness or weakness on the arms or legs, she denies any balance issues or difficulty controlling the bowels or the bladder. The patient indicates that her mother also has migraine headaches. The patient reports a throbbing and pressure sensation, with photophobia and phonophobia. She may have some discomfort in the right ear as well. She comes to this office for further evaluation.  Past Medical History  Diagnosis Date  . Asthma   . Common migraine with intractable migraine 10/13/2015    History reviewed. No pertinent past surgical history.  Family History  Problem Relation Age of Onset  . Migraines Mother     Social history:  reports that she has never smoked. She has never used smokeless tobacco. She reports that she drinks alcohol. She reports that she does not use illicit drugs.  Medications:  Prior to Admission medications   Medication Sig Start Date End Date Taking? Authorizing  Provider  albuterol (PROVENTIL HFA;VENTOLIN HFA) 108 (90 Base) MCG/ACT inhaler Inhale 2 puffs into the lungs every 6 (six) hours as needed for wheezing or shortness of breath.   Yes Historical Provider, MD  dextromethorphan-guaiFENesin (MUCINEX DM) 30-600 MG per 12 hr tablet Take 1 tablet by mouth 2 (two) times daily. 05/01/15  Yes Benjamin Cartner, PA-C  ondansetron (ZOFRAN) 4 MG tablet Take 1 tablet (4 mg total) by mouth every 6 (six) hours. 10/06/15  Yes Tharon Aquas, PA  rizatriptan (MAXALT-MLT) 10 MG disintegrating tablet Take 1 tablet (10 mg total) by mouth as needed for migraine. May repeat in 2 hours if needed 10/06/15  Yes Tharon Aquas, PA      Allergies  Allergen Reactions  . Other Rash    pediazole  . Strawberry Flavor Rash    ROS:  Out of a complete 14 system review of symptoms, the patient complains only of the following symptoms, and all other reviewed systems are negative.  Fevers, chills, weight loss Chest pain Ringing in the ears Blurred vision, eye pain, cough, wheezing Diarrhea Feeling cold Achy muscles Headache, weakness, dizziness  Blood pressure 117/77, pulse 73, height 5' 9.5" (1.765 m), weight 112 lb 8 oz (51.03 kg), last menstrual period 09/24/2015.  Physical Exam  General: The patient is alert and cooperative at the time of the examination.  Eyes: Pupils are equal, round, and reactive to light. Discs are flat bilaterally. Good venous pulsations are seen bilaterally.  Neck: The neck is supple, no carotid bruits are noted.  Respiratory: The respiratory examination  is clear.  Cardiovascular: The cardiovascular examination reveals a regular rate and rhythm, no obvious murmurs or rubs are noted.  Skin: Extremities are without significant edema.  Neurologic Exam  Mental status: The patient is alert and oriented x 3 at the time of the examination. The patient has apparent normal recent and remote memory, with an apparently normal attention span and  concentration ability.  Cranial nerves: Facial symmetry is present. There is good sensation of the face to pinprick and soft touch bilaterally. The strength of the facial muscles and the muscles to head turning and shoulder shrug are normal bilaterally. Speech is well enunciated, no aphasia or dysarthria is noted. Extraocular movements are full. Visual fields are full. The tongue is midline, and the patient has symmetric elevation of the soft palate. No obvious hearing deficits are noted.  Motor: The motor testing reveals 5 over 5 strength of all 4 extremities. Good symmetric motor tone is noted throughout.  Sensory: Sensory testing is intact to pinprick, soft touch, vibration sensation, and position sense on all 4 extremities. No evidence of extinction is noted.  Coordination: Cerebellar testing reveals good finger-nose-finger and heel-to-shin bilaterally.  Gait and station: Gait is normal. Tandem gait is normal. Romberg is negative. No drift is seen.  Reflexes: Deep tendon reflexes are symmetric and normal bilaterally. Toes are downgoing bilaterally.   Assessment/Plan:  1. Common migraine headache, intractable  The patient is having daily headaches at this time. She has a history of asthma, beta blockers cannot be used. She will be placed on Topamax, working up on the dose. We will place her on a prednisone Dosepak, 5 mg 6 day pack. She will be given Toradol to take if needed. She has been given Maxalt through the emergency room, this has not been effective. Advil and Excedrin migraine have not been helpful. She will follow-up in 2 months, sooner if needed. We may consider MRI evaluation of the brain in the future if her headaches do not abate. The patient requests a referral to a primary care physician, this will be set up.  Marlan Palau MD 10/13/2015 2:48 PM  [facility] [address] [place], [place] [address]  Phone [phone] Fax  [phone]

## 2015-12-04 ENCOUNTER — Emergency Department (HOSPITAL_COMMUNITY): Payer: Federal, State, Local not specified - PPO

## 2015-12-04 ENCOUNTER — Emergency Department (HOSPITAL_COMMUNITY)
Admission: EM | Admit: 2015-12-04 | Discharge: 2015-12-04 | Disposition: A | Discharge status: Home / Self Care | Payer: Federal, State, Local not specified - PPO | Attending: Emergency Medicine | Admitting: Emergency Medicine

## 2015-12-04 ENCOUNTER — Encounter (HOSPITAL_COMMUNITY): Payer: Self-pay | Admitting: Emergency Medicine

## 2015-12-04 DIAGNOSIS — Z79899 Other long term (current) drug therapy: Secondary | ICD-10-CM | POA: Diagnosis not present

## 2015-12-04 DIAGNOSIS — Z8679 Personal history of other diseases of the circulatory system: Secondary | ICD-10-CM | POA: Diagnosis not present

## 2015-12-04 DIAGNOSIS — J45909 Unspecified asthma, uncomplicated: Secondary | ICD-10-CM | POA: Insufficient documentation

## 2015-12-04 DIAGNOSIS — R0789 Other chest pain: Secondary | ICD-10-CM

## 2015-12-04 DIAGNOSIS — R51 Headache: Secondary | ICD-10-CM | POA: Insufficient documentation

## 2015-12-04 DIAGNOSIS — R079 Chest pain, unspecified: Secondary | ICD-10-CM | POA: Diagnosis present

## 2015-12-04 DIAGNOSIS — R519 Headache, unspecified: Secondary | ICD-10-CM

## 2015-12-04 LAB — CBC
HCT: 38.8 % (ref 36.0–46.0)
Hemoglobin: 11.9 g/dL — ABNORMAL LOW (ref 12.0–15.0)
MCH: 23.4 pg — ABNORMAL LOW (ref 26.0–34.0)
MCHC: 30.7 g/dL (ref 30.0–36.0)
MCV: 76.4 fL — ABNORMAL LOW (ref 78.0–100.0)
Platelets: 214 10*3/uL (ref 150–400)
RBC: 5.08 MIL/uL (ref 3.87–5.11)
RDW: 12.9 % (ref 11.5–15.5)
WBC: 7.4 10*3/uL (ref 4.0–10.5)

## 2015-12-04 LAB — I-STAT TROPONIN, ED: Troponin i, poc: 0 ng/mL (ref 0.00–0.08)

## 2015-12-04 LAB — BASIC METABOLIC PANEL
Anion gap: 8 (ref 5–15)
BUN: 11 mg/dL (ref 6–20)
CO2: 22 mmol/L (ref 22–32)
Calcium: 9.8 mg/dL (ref 8.9–10.3)
Chloride: 108 mmol/L (ref 101–111)
Creatinine, Ser: 0.7 mg/dL (ref 0.44–1.00)
GFR calc Af Amer: >60 mL/min (ref >60)
GFR calc non Af Amer: >60 mL/min (ref >60)
Glucose, Bld: 94 mg/dL (ref 65–99)
Potassium: 4.1 mmol/L (ref 3.5–5.1)
Sodium: 138 mmol/L (ref 135–145)

## 2015-12-04 MED ORDER — PROCHLORPERAZINE EDISYLATE 5 MG/ML IJ SOLN
10.0000 mg | Freq: Once | INTRAMUSCULAR | Status: AC
  Administered 2015-12-04: 10 mg via INTRAVENOUS
  Filled 2015-12-04: qty 2

## 2015-12-04 MED ORDER — KETOROLAC TROMETHAMINE 15 MG/ML IJ SOLN
15.0000 mg | Freq: Once | INTRAMUSCULAR | Status: AC
  Administered 2015-12-04: 15 mg via INTRAVENOUS
  Filled 2015-12-04: qty 1

## 2015-12-04 MED ORDER — RIZATRIPTAN BENZOATE 10 MG PO TBDP: 10.0000 mg | ORAL_TABLET | ORAL | Status: AC | PRN

## 2015-12-04 MED ORDER — METOCLOPRAMIDE HCL 5 MG PO TABS: 5.0000 mg | ORAL_TABLET | Freq: Four times a day (QID) | ORAL | Status: AC | PRN

## 2015-12-04 MED ORDER — DIPHENHYDRAMINE HCL 50 MG/ML IJ SOLN
12.5000 mg | Freq: Once | INTRAMUSCULAR | Status: AC
  Administered 2015-12-04: 12.5 mg via INTRAVENOUS
  Filled 2015-12-04: qty 1

## 2015-12-04 MED ORDER — SODIUM CHLORIDE 0.9 % IV BOLUS (SEPSIS)
1000.0000 mL | Freq: Once | INTRAVENOUS | Status: AC
  Administered 2015-12-04: 1000 mL via INTRAVENOUS

## 2015-12-04 NOTE — Discharge Instructions (Signed)

## 2015-12-04 NOTE — ED Notes (Signed)
Pt returned from xray

## 2015-12-04 NOTE — ED Notes (Signed)
Pt verbalized understanding of d/c instructions, prescriptions, and follow-up care. No further questions/concerns, VSS, ambulatory w/ steady gait (refused wheelchair) 

## 2015-12-04 NOTE — ED Notes (Signed)
Pt transported to xray 

## 2015-12-04 NOTE — ED Notes (Addendum)
Pt c/o left sided chest pain and migraine headache x 2 days. Pt reports experienced nausea yesterday. Pt denies cough or cold symptoms.

## 2015-12-11 NOTE — ED Provider Notes (Signed)
CSN: [id number]     Arrival date & time 12/04/15  4098 History   First MD Initiated Contact with Patient 12/04/15 [phone]     Chief Complaint  Patient presents with  . Chest Pain  . Migraine     (Consider location/radiation/quality/duration/timing/severity/associated sxs/prior Treatment) HPI   20 year old female with headache. She has a past history of what she calls migraine headaches. She has previously been evaluated by neurology for the same. Previously prescribed medications for her headaches, but currently out. Current headache started about 2 days ago. Gradual onset. Denies any trauma. Headache is diffuse, but worse in the frontal and right temporal regions. No appreciable exacerbating relieving factors. Associated nausea and photophobia. No change in visual acuity. No acute numbness, tingling or focal loss of strength. No fevers or chills. Has not tried taking anything for her symptoms recently. Additionally, she is complaints of chest pain. Sharp pain along her left sternal border. Is very brief. Stabbing in quality. Worse with certain movements and sometimes taking deep breaths. No change with exertion. No dyspnea. No cough. No unusual leg pain or swelling.  Past Medical History  Diagnosis Date  . Asthma   . Common migraine with intractable migraine 10/13/2015   History reviewed. No pertinent past surgical history. Family History  Problem Relation Age of Onset  . Migraines Mother    Social History  Substance Use Topics  . Smoking status: Never Smoker   . Smokeless tobacco: Never Used  . Alcohol Use: No   OB History    No data available     Review of Systems  All systems reviewed and negative, other than as noted in HPI.   Allergies  Other and Strawberry flavor  Home Medications   Prior to Admission medications   Medication Sig Start Date End Date Taking? Authorizing Provider  albuterol (PROVENTIL HFA;VENTOLIN HFA) 108 (90 Base) MCG/ACT inhaler Inhale 2 puffs into  the lungs every 6 (six) hours as needed for wheezing or shortness of breath.   Yes Historical Provider, MD  fluticasone (FLONASE) 50 MCG/ACT nasal spray Place 2 sprays into both nostrils daily as needed for allergies or rhinitis.   Yes Historical Provider, MD  Fluticasone-Salmeterol (ADVAIR) 250-50 MCG/DOSE AEPB Inhale 2 puffs into the lungs 2 (two) times daily as needed (for wheezing or shortness of breath).   Yes Historical Provider, MD  ketorolac (TORADOL) 10 MG tablet Take 1 tablet (10 mg total) by mouth every 8 (eight) hours as needed. Patient taking differently: Take 10 mg by mouth every 8 (eight) hours as needed for moderate pain.  10/13/15  Yes York Spaniel, MD  topiramate (TOPAMAX) 25 MG tablet Take one tablet at night for one week, then take 2 tablets at night for one week, then take 3 tablets at night. Patient taking differently: Take 75 mg by mouth at bedtime. Take one tablet at night for one week, then take 2 tablets at night for one week, then take 3 tablets at night. 10/13/15  Yes York Spaniel, MD  metoCLOPramide (REGLAN) 5 MG tablet Take 1 tablet (5 mg total) by mouth every 6 (six) hours as needed for nausea. 12/04/15   Raeford Razor, MD  rizatriptan (MAXALT-MLT) 10 MG disintegrating tablet Take 1 tablet (10 mg total) by mouth as needed for migraine. May repeat in 2 hours if needed 12/04/15   Raeford Razor, MD   BP 106/68 mmHg  Pulse 65  Temp(Src) 98.1 F (36.7 C) (Oral)  Resp 20  Ht  (  1.753 m)  Wt 115 lb 4 oz (52.277 kg)  BMI 17.01 kg/m2  SpO2 100%  LMP 11/20/2015 Physical Exam  Constitutional: She is oriented to person, place, and time. She appears well-developed and well-nourished. No distress.  HENT:  Head: Normocephalic and atraumatic.  Eyes: Conjunctivae and EOM are normal. Pupils are equal, round, and reactive to light. Right eye exhibits no discharge. Left eye exhibits no discharge.  Neck: Neck supple.  No nuchal rigidity  Cardiovascular: Normal rate, regular  rhythm and normal heart sounds.  Exam reveals no gallop and no friction rub.   No murmur heard. Pulmonary/Chest: Effort normal and breath sounds normal. No respiratory distress.  Abdominal: Soft. She exhibits no distension. There is no tenderness.  Musculoskeletal: She exhibits no edema or tenderness.  Neurological: She is alert and oriented to person, place, and time. No cranial nerve deficit. She exhibits normal muscle tone. Coordination normal.  Normal-appearing gait  Skin: Skin is warm and dry.  Psychiatric: She has a normal mood and affect. Her behavior is normal. Thought content normal.  Nursing note and vitals reviewed.   ED Course  Procedures (including critical care time) Labs Review Labs Reviewed  CBC - Abnormal; Notable for the following:    Hemoglobin 11.9 (*)    MCV 76.4 (*)    MCH 23.4 (*)    All other components within normal limits  BASIC METABOLIC PANEL  I-STAT TROPOININ, ED    Imaging Review No results found. I have personally reviewed and evaluated these images and lab results as part of my medical decision-making.   EKG Interpretation   Date/Time:  Friday December 04 2015 09:06:58 EDT Ventricular Rate:  75 PR Interval:  148 QRS Duration: 70 QT Interval:  388 QTC Calculation: 433 R Axis:   68 Text Interpretation:  Normal sinus rhythm with sinus arrhythmia Normal ECG  Confirmed by Socorro Kanitz  MD, Moe Graca ([id number]) on 12/04/2015 10:21:29 AM      MDM   Final diagnoses:  Nonintractable headache, unspecified chronicity pattern, unspecified headache type  Atypical chest pain    20 year old female with headache and chest pain.  Very low suspicion for emergent etiology of other complaints. Chest pain is atypical for ACS. Doubt PE, dissection or other emergent pathology. Headache is now sniffily improved. No trauma. Afebrile. Nonfocal neuro exam and no specific neurological complaints. No blood thinners.  Now feeling better. I feel she is appropriate for  discharge. Return precautions were discussed.      Raeford RazorStephen Merilynn Haydu, MD 12/11/15 1258

## 2015-12-15 ENCOUNTER — Ambulatory Visit (INDEPENDENT_AMBULATORY_CARE_PROVIDER_SITE_OTHER): Payer: Federal, State, Local not specified - PPO | Admitting: Adult Health

## 2015-12-15 ENCOUNTER — Encounter: Payer: Self-pay | Admitting: Adult Health

## 2015-12-15 VITALS — BP 114/68 | HR 89 | Ht 69.0 in | Wt 115.4 lb

## 2015-12-15 DIAGNOSIS — G43009 Migraine without aura, not intractable, without status migrainosus: Secondary | ICD-10-CM

## 2015-12-15 MED ORDER — TOPIRAMATE 25 MG PO TABS: 100.0000 mg | ORAL_TABLET | Freq: Every day | ORAL | Status: AC

## 2015-12-15 NOTE — Patient Instructions (Signed)
Increase Topamax to 4 tablets at bedtime If your symptoms worsen or you develop new symptoms please let us know.

## 2015-12-15 NOTE — Progress Notes (Signed)
I have read the note, and I agree with the clinical assessment and plan.  Jennifer Vance,Jennifer Vance   

## 2015-12-15 NOTE — Progress Notes (Signed)
PATIENT: Jennifer PortelaAryn Vance DOB: 09-Nov-1995  REASON FOR VISIT: follow up- migraine headaches HISTORY FROM: patient  HISTORY OF PRESENT ILLNESS: Jennifer Vance is a 20 year old female with a history of migraine headaches. She returns today for follow-up. She states that when she initially started Topamax her headaches resolved for approximately 2 weeks. Then her headache frequency started to increase. She states that she is now having 3-4 headaches a week. Approximately one week ago she had to go to the emergency room for a "migraine cocktail." She states that her headaches continue to be located in the right frontotemporal region. She will have photophobia and phonophobia as well as nausea. She states that she has tried  Toradol and it gives her some relief. She denies any new neurological symptoms. She returns today for an evaluation.  HISTORY 10/13/15 (Jennifer Vance):  Jennifer Vance is a 20 year old right-handed black female with a history of regular headaches since she was 20 years old. The patient reports that she usually will have one headache every 2 weeks, but 4 weeks ago, her headaches converted to being daily in nature, and they are preventing her from working regularly, she has not worked in about 10 days. The patient has been to the emergency room on 10/06/2015, they gave her a "migraine cocktail" which helped her headache for about 4 hours, but then the headache returned. The headache is always present, generally worse in the morning. She indicates that the headaches are primarily right-sided in nature in the right frontotemporal region, but may spread back to the occipital area without neck stiffness. The patient may have nausea and vomiting, some blurring of vision and some spots in front of the eyes. The patient reports no numbness or weakness on the arms or legs, she denies any balance issues or difficulty controlling the bowels or the bladder. The patient indicates that her mother also has migraine  headaches. The patient reports a throbbing and pressure sensation, with photophobia and phonophobia. She may have some discomfort in the right ear as well. She comes to this office for further evaluation.  REVIEW OF SYSTEMS: Out of a complete 14 system review of symptoms, the patient complains only of the following symptoms, and all other reviewed systems are negative.  Light sensitivity, eye pain, chest pain, ear pain, nausea, headache, swollen lymph nodes  ALLERGIES: Allergies  Allergen Reactions  . Other Rash    pediazole  . Strawberry Flavor Rash    HOME MEDICATIONS: Outpatient Prescriptions Prior to Visit  Medication Sig Dispense Refill  . albuterol (PROVENTIL HFA;VENTOLIN HFA) 108 (90 Base) MCG/ACT inhaler Inhale 2 puffs into the lungs every 6 (six) hours as needed for wheezing or shortness of breath.    . fluticasone (FLONASE) 50 MCG/ACT nasal spray Place 2 sprays into both nostrils daily as needed for allergies or rhinitis.    . Fluticasone-Salmeterol (ADVAIR) 250-50 MCG/DOSE AEPB Inhale 2 puffs into the lungs 2 (two) times daily as needed (for wheezing or shortness of breath).    Marland Kitchen. ketorolac (TORADOL) 10 MG tablet Take 1 tablet (10 mg total) by mouth every 8 (eight) hours as needed. (Patient taking differently: Take 10 mg by mouth every 8 (eight) hours as needed for moderate pain. ) 30 tablet 1  . metoCLOPramide (REGLAN) 5 MG tablet Take 1 tablet (5 mg total) by mouth every 6 (six) hours as needed for nausea. 20 tablet 0  . rizatriptan (MAXALT-MLT) 10 MG disintegrating tablet Take 1 tablet (10 mg total) by mouth as needed for  migraine. May repeat in 2 hours if needed 10 tablet 0  . topiramate (TOPAMAX) 25 MG tablet Take one tablet at night for one week, then take 2 tablets at night for one week, then take 3 tablets at night. (Patient taking differently: Take 75 mg by mouth at bedtime. Take one tablet at night for one week, then take 2 tablets at night for one week, then take 3 tablets  at night.) 90 tablet 3   No facility-administered medications prior to visit.    PAST MEDICAL HISTORY: Past Medical History  Diagnosis Date  . Asthma   . Common migraine with intractable migraine 10/13/2015    PAST SURGICAL HISTORY: History reviewed. No pertinent past surgical history.  FAMILY HISTORY: Family History  Problem Relation Age of Onset  . Migraines Mother     SOCIAL HISTORY: Social History   Social History  . Marital Status: Single    Spouse Name: N/A  . Number of Children: 0  . Years of Education: N/A   Occupational History  . [employer]    Social History Main Topics  . Smoking status: Never Smoker   . Smokeless tobacco: Never Used  . Alcohol Use: No  . Drug Use: No  . Sexual Activity: Not on file   Other Topics Concern  . Not on file   Social History Narrative   Patient does not drink caffeine.   Patient is right handed.       PHYSICAL EXAM  Filed Vitals:   12/15/15 0957  BP: 114/68  Pulse: 89  Height:  (1.753 m)  Weight: 115 lb 6.4 oz (52.345 kg)   Body mass index is 17.03 kg/(m^2).  Generalized: Well developed, in no acute distress   Neurological examination  Mentation: Alert oriented to time, place, history taking. Follows all commands speech and language fluent Cranial nerve II-XII: Pupils were equal round reactive to light. Extraocular movements were full, visual field were full on confrontational test. Facial sensation and strength were normal. Uvula tongue midline. Head turning and shoulder shrug  were normal and symmetric. Motor: The motor testing reveals 5 over 5 strength of all 4 extremities. Good symmetric motor tone is noted throughout.  Sensory: Sensory testing is intact to soft touch on all 4 extremities. No evidence of extinction is noted.  Coordination: Cerebellar testing reveals good finger-nose-finger and heel-to-shin bilaterally.  Gait and station: Gait is normal. Tandem gait is normal. Romberg is  negative. No drift is seen.  Reflexes: Deep tendon reflexes are symmetric and normal bilaterally.   DIAGNOSTIC DATA (LABS, IMAGING, TESTING) - I reviewed patient records, labs, notes, testing and imaging myself where available.  Lab Results  Component Value Date   WBC 7.4 12/04/2015   HGB 11.9* 12/04/2015   HCT 38.8 12/04/2015   MCV 76.4* 12/04/2015   PLT 214 12/04/2015      Component Value Date/Time   NA 138 12/04/2015 0915   K 4.1 12/04/2015 0915   CL 108 12/04/2015 0915   CO2 22 12/04/2015 0915   GLUCOSE 94 12/04/2015 0915   BUN 11 12/04/2015 0915   CREATININE 0.70 12/04/2015 0915   CALCIUM 9.8 12/04/2015 0915   GFRNONAA >60 12/04/2015 0915   GFRAA >60 12/04/2015 0915      ASSESSMENT AND PLAN 20 y.o. year old female  has a past medical history of Asthma and Common migraine with intractable migraine (10/13/2015). here with:  1. Migraine headache  We will increase the patient's Topamax to 100 mg at  bedtime. She will continue using Toradol as needed. Patient advised that if this does not decrease her headache frequency she should let us know. Also if she develops any new symptoms associated with the headache she should make [place] aware. She will follow-up in 3 months or sooner if needed.     Butch Penny, MSN, NP-C 12/15/2015, 10:34 AM [facility] [address] [place], [place] [address] [phone]

## 2016-03-14 ENCOUNTER — Telehealth: Payer: Self-pay

## 2016-03-14 ENCOUNTER — Ambulatory Visit: Payer: Federal, State, Local not specified - PPO | Admitting: Adult Health

## 2016-03-14 NOTE — Telephone Encounter (Signed)
Pt no-showed her follow-up appt this morning. 

## 2016-03-15 ENCOUNTER — Encounter: Payer: Self-pay | Admitting: Adult Health

## 2016-05-20 ENCOUNTER — Encounter (HOSPITAL_COMMUNITY): Payer: Self-pay | Admitting: Emergency Medicine

## 2016-05-20 ENCOUNTER — Emergency Department (HOSPITAL_COMMUNITY): Payer: Federal, State, Local not specified - PPO

## 2016-05-20 ENCOUNTER — Emergency Department (HOSPITAL_COMMUNITY)
Admission: EM | Admit: 2016-05-20 | Discharge: 2016-05-20 | Disposition: A | Discharge status: Home / Self Care | Payer: Federal, State, Local not specified - PPO | Attending: Emergency Medicine | Admitting: Emergency Medicine

## 2016-05-20 DIAGNOSIS — Y999 Unspecified external cause status: Secondary | ICD-10-CM | POA: Diagnosis not present

## 2016-05-20 DIAGNOSIS — S01412A Laceration without foreign body of left cheek and temporomandibular area, initial encounter: Secondary | ICD-10-CM | POA: Insufficient documentation

## 2016-05-20 DIAGNOSIS — M79641 Pain in right hand: Secondary | ICD-10-CM

## 2016-05-20 DIAGNOSIS — S0181XA Laceration without foreign body of other part of head, initial encounter: Secondary | ICD-10-CM

## 2016-05-20 DIAGNOSIS — Y939 Activity, unspecified: Secondary | ICD-10-CM | POA: Diagnosis not present

## 2016-05-20 DIAGNOSIS — J45909 Unspecified asthma, uncomplicated: Secondary | ICD-10-CM | POA: Diagnosis not present

## 2016-05-20 DIAGNOSIS — Y929 Unspecified place or not applicable: Secondary | ICD-10-CM | POA: Insufficient documentation
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
## 2016-08-10 ENCOUNTER — Encounter (HOSPITAL_COMMUNITY): Payer: Self-pay | Admitting: Emergency Medicine

## 2016-08-10 ENCOUNTER — Ambulatory Visit (HOSPITAL_COMMUNITY)
Admission: EM | Admit: 2016-08-10 | Discharge: 2016-08-10 | Disposition: A | Discharge status: Home / Self Care | Payer: Federal, State, Local not specified - PPO | Attending: Family Medicine | Admitting: Family Medicine

## 2016-08-10 DIAGNOSIS — R059 Cough, unspecified: Secondary | ICD-10-CM

## 2016-08-10 DIAGNOSIS — J208 Acute bronchitis due to other specified organisms: Secondary | ICD-10-CM

## 2016-08-10 DIAGNOSIS — R05 Cough: Secondary | ICD-10-CM | POA: Diagnosis not present

## 2016-08-10 MED ORDER — PREDNISONE 10 MG PO TABS: ORAL_TABLET | ORAL | 0 refills | Status: DC

## 2016-08-10 MED ORDER — BENZONATATE 100 MG PO CAPS: 100.0000 mg | ORAL_CAPSULE | Freq: Three times a day (TID) | ORAL | 0 refills | Status: AC

## 2016-08-10 MED ORDER — AMOXICILLIN 875 MG PO TABS: 875.0000 mg | ORAL_TABLET | Freq: Two times a day (BID) | ORAL | 0 refills | Status: AC

## 2016-08-10 NOTE — ED Triage Notes (Signed)
[facility] with a complaint of a productive cough with congestion x 1 week. The patient also reported a headache. The patient stated that she has used OTC meds with minimal results.

## 2016-08-10 NOTE — ED Provider Notes (Signed)
CSN: [id number]     Arrival date & time 08/10/16  1242 History   First MD Initiated Contact with Patient 08/10/16 1343     Chief Complaint  Patient presents with  . Cough   (Consider location/radiation/quality/duration/timing/severity/associated sxs/prior Treatment) Patient c/o cough and uri sx's for a week.    Cough  Cough characteristics:  Productive Sputum characteristics:  Yellow Severity:  Moderate Duration:  1 week Timing:  Intermittent Progression:  Worsening Chronicity:  Recurrent Smoker: no   Context: upper respiratory infection   Relieved by:  Nothing Worsened by:  Nothing Ineffective treatments:  Rest and cough suppressants Associated symptoms: headaches and sore throat     Past Medical History:  Diagnosis Date  . Asthma   . Common migraine with intractable migraine 10/13/2015   History reviewed. No pertinent surgical history. Family History  Problem Relation Age of Onset  . Migraines Mother    Social History  Substance Use Topics  . Smoking status: Never Smoker  . Smokeless tobacco: Never Used  . Alcohol use No   OB History    No data available     Review of Systems  Constitutional: Positive for fatigue.  HENT: Positive for sore throat.   Eyes: Negative.   Respiratory: Positive for cough.   Cardiovascular: Negative.   Gastrointestinal: Negative.   Endocrine: Negative.   Genitourinary: Negative.   Musculoskeletal: Negative.   Skin: Negative.   Allergic/Immunologic: Negative.   Neurological: Positive for headaches.  Hematological: Negative.   Psychiatric/Behavioral: Negative.     Allergies  Other and Strawberry flavor  Home Medications   Prior to Admission medications   Medication Sig Start Date End Date Taking? Authorizing Provider  albuterol (PROVENTIL HFA;VENTOLIN HFA) 108 (90 Base) MCG/ACT inhaler Inhale 2 puffs into the lungs every 6 (six) hours as needed for wheezing or shortness of breath.    Historical Provider, MD   amoxicillin (AMOXIL) 875 MG tablet Take 1 tablet (875 mg total) by mouth 2 (two) times daily. 08/10/16   Deatra CanterWilliam J Alazar Cherian, FNP  benzonatate (TESSALON) 100 MG capsule Take 1 capsule (100 mg total) by mouth every 8 (eight) hours. 08/10/16   Deatra CanterWilliam J Jazzalynn Rhudy, FNP  fluticasone (FLONASE) 50 MCG/ACT nasal spray Place 2 sprays into both nostrils daily as needed for allergies or rhinitis.    Historical Provider, MD  Fluticasone-Salmeterol (ADVAIR) 250-50 MCG/DOSE AEPB Inhale 2 puffs into the lungs 2 (two) times daily as needed (for wheezing or shortness of breath).    Historical Provider, MD  ketorolac (TORADOL) 10 MG tablet Take 1 tablet (10 mg total) by mouth every 8 (eight) hours as needed. Patient taking differently: Take 10 mg by mouth every 8 (eight) hours as needed for moderate pain.  10/13/15   York Spanielharles K Willis, MD  metoCLOPramide (REGLAN) 5 MG tablet Take 1 tablet (5 mg total) by mouth every 6 (six) hours as needed for nausea. 12/04/15   Raeford RazorStephen Kohut, MD  predniSONE (DELTASONE) 10 MG tablet 4 po qd x 2d 3 po qd x 2d 2 po qd x 2d then 1 po qd x 2d then stop 08/10/16   Deatra CanterWilliam J Rondy Krupinski, FNP  rizatriptan (MAXALT-MLT) 10 MG disintegrating tablet Take 1 tablet (10 mg total) by mouth as needed for migraine. May repeat in 2 hours if needed 12/04/15   Raeford RazorStephen Kohut, MD  topiramate (TOPAMAX) 25 MG tablet Take 4 tablets (100 mg total) by mouth at bedtime. 12/15/15   Butch PennyMegan Millikan, NP   Meds Ordered and Administered this  Visit  Medications - No data to display  BP 101/58 (BP Location: Left Arm)   Pulse 69   Temp 98.2 F (36.8 C) (Oral)   Resp 16   LMP 07/21/2016 (Exact Date)   SpO2 100%  No data found.   Physical Exam  Constitutional: She appears well-developed and well-nourished.  HENT:  Head: Normocephalic and atraumatic.  Right Ear: External ear normal.  Left Ear: External ear normal.  Mouth/Throat: Oropharynx is clear and moist.  Eyes: Conjunctivae and EOM are normal. Pupils are equal, round,  and reactive to light.  Neck: Normal range of motion. Neck supple.  Cardiovascular: Normal rate, regular rhythm and normal heart sounds.   Pulmonary/Chest: Effort normal and breath sounds normal.  Abdominal: Soft. Bowel sounds are normal.  Nursing note and vitals reviewed.   Urgent Care Course   Clinical Course     Procedures (including critical care time)  Labs Review Labs Reviewed - No data to display  Imaging Review No results found.   Visual Acuity Review  Right Eye Distance:   Left Eye Distance:   Bilateral Distance:    Right Eye Near:   Left Eye Near:    Bilateral Near:         MDM   1. Acute bronchitis due to other specified organisms   2. Cough    Zpak as directed Tessalon perles  Push po fluids, rest, tylenol and motrin otc prn as directed for fever, arthralgias, and myalgias.  Follow up prn if sx's continue or persist.    Deatra CanterWilliam J Brexton Sofia, FNP 08/10/16 [phone]

## 2016-12-28 ENCOUNTER — Encounter (HOSPITAL_COMMUNITY): Payer: Self-pay

## 2016-12-28 ENCOUNTER — Emergency Department (HOSPITAL_COMMUNITY)
Admission: EM | Admit: 2016-12-28 | Discharge: 2016-12-28 | Disposition: A | Discharge status: Home / Self Care | Payer: Federal, State, Local not specified - PPO | Attending: Emergency Medicine | Admitting: Emergency Medicine

## 2016-12-28 DIAGNOSIS — J302 Other seasonal allergic rhinitis: Secondary | ICD-10-CM | POA: Diagnosis not present

## 2016-12-28 DIAGNOSIS — Z79899 Other long term (current) drug therapy: Secondary | ICD-10-CM | POA: Diagnosis not present

## 2016-12-28 DIAGNOSIS — J029 Acute pharyngitis, unspecified: Secondary | ICD-10-CM | POA: Diagnosis present

## 2016-12-28 LAB — RAPID STREP SCREEN (MED CTR MEBANE ONLY): Streptococcus, Group A Screen (Direct): NEGATIVE

## 2016-12-28 MED ORDER — MAGIC MOUTHWASH W/LIDOCAINE: 10.0000 mL | Freq: Four times a day (QID) | ORAL | 0 refills | Status: AC | PRN

## 2016-12-28 MED ORDER — CETIRIZINE HCL 10 MG PO TABS: 10.0000 mg | ORAL_TABLET | Freq: Every day | ORAL | 1 refills | Status: AC

## 2016-12-28 NOTE — ED Provider Notes (Signed)
[facility]-EMERGENCY DEPT Provider Note   CSN: [id number] Arrival date & time: 12/28/16  7829     History   Chief Complaint Chief Complaint  Patient presents with  . Sore Throat    HPI Jennifer Vance is a 21 y.o. female.  The history is provided by the patient. No language interpreter was used.  Sore Throat  This is a new problem. The current episode started yesterday. The problem occurs constantly. The problem has not changed since onset.Pertinent negatives include no abdominal pain and no shortness of breath. The symptoms are aggravated by swallowing. Nothing relieves the symptoms. Treatments tried: Theraflu. The treatment provided no relief.    Past Medical History:  Diagnosis Date  . Asthma   . Common migraine with intractable migraine 10/13/2015    Patient Active Problem List   Diagnosis Date Noted  . Common migraine with intractable migraine 10/13/2015    History reviewed. No pertinent surgical history.  OB History    No data available       Home Medications    Prior to Admission medications   Medication Sig Start Date End Date Taking? Authorizing Provider  albuterol (PROVENTIL HFA;VENTOLIN HFA) 108 (90 Base) MCG/ACT inhaler Inhale 2 puffs into the lungs every 6 (six) hours as needed for wheezing or shortness of breath.    Historical Provider, MD  amoxicillin (AMOXIL) 875 MG tablet Take 1 tablet (875 mg total) by mouth 2 (two) times daily. 08/10/16   Deatra Canter, FNP  benzonatate (TESSALON) 100 MG capsule Take 1 capsule (100 mg total) by mouth every 8 (eight) hours. 08/10/16   Deatra Canter, FNP  cetirizine (ZYRTEC ALLERGY) 10 MG tablet Take 1 tablet (10 mg total) by mouth daily. 12/28/16   Antony Madura, PA-C  fluticasone (FLONASE) 50 MCG/ACT nasal spray Place 2 sprays into both nostrils daily as needed for allergies or rhinitis.    Historical Provider, MD  Fluticasone-Salmeterol (ADVAIR) 250-50 MCG/DOSE AEPB Inhale 2 puffs into the lungs 2 (two) times daily as  needed (for wheezing or shortness of breath).    Historical Provider, MD  ketorolac (TORADOL) 10 MG tablet Take 1 tablet (10 mg total) by mouth every 8 (eight) hours as needed. Patient taking differently: Take 10 mg by mouth every 8 (eight) hours as needed for moderate pain.  10/13/15   York Spaniel, MD  magic mouthwash w/lidocaine SOLN Take 10 mLs by mouth 4 (four) times daily as needed (sore throat). Gargle and swallow. 12/28/16   Antony Madura, PA-C  metoCLOPramide (REGLAN) 5 MG tablet Take 1 tablet (5 mg total) by mouth every 6 (six) hours as needed for nausea. 12/04/15   Raeford Razor, MD  predniSONE (DELTASONE) 10 MG tablet 4 po qd x 2d 3 po qd x 2d 2 po qd x 2d then 1 po qd x 2d then stop 08/10/16   Deatra Canter, FNP  rizatriptan (MAXALT-MLT) 10 MG disintegrating tablet Take 1 tablet (10 mg total) by mouth as needed for migraine. May repeat in 2 hours if needed 12/04/15   Raeford Razor, MD  topiramate (TOPAMAX) 25 MG tablet Take 4 tablets (100 mg total) by mouth at bedtime. 12/15/15   Butch Penny, NP    Family History Family History  Problem Relation Age of Onset  . Migraines Mother     Social History Social History  Substance Use Topics  . Smoking status: Never Smoker  . Smokeless tobacco: Never Used  . Alcohol use No     Allergies  Other and Strawberry flavor   Review of Systems Review of Systems  HENT: Positive for congestion, rhinorrhea, sneezing and sore throat.   Eyes: Positive for itching.  Respiratory: Negative for shortness of breath.   Gastrointestinal: Negative for abdominal pain.  Ten systems reviewed and are negative for acute change, except as noted in the HPI.     Physical Exam Updated Vital Signs BP 98/68 (BP Location: Left Arm)   Pulse 68   Temp 98.1 F (36.7 C) (Oral)   Resp 18   LMP 11/28/2016   SpO2 100%   Physical Exam  Constitutional: She is oriented to person, place, and time. She appears well-developed and well-nourished. No distress.   Nontoxic and in no acute distress  HENT:  Head: Normocephalic and atraumatic.  Audible nasal congestion. No rhinorrhea. Uvula midline. Oropharynx clear. No exudates. Patient tolerating secretions without difficulty.  Eyes: Conjunctivae and EOM are normal. No scleral icterus.  Neck: Normal range of motion.  No nuchal rigidity or meningismus  Cardiovascular: Normal rate, regular rhythm and intact distal pulses.   Pulmonary/Chest: Effort normal. No respiratory distress. She has no wheezes. She has no rales.  Lungs CTAB  Musculoskeletal: Normal range of motion.  Neurological: She is alert and oriented to person, place, and time. She exhibits normal muscle tone. Coordination normal.  Skin: Skin is warm and dry. No rash noted. She is not diaphoretic. No erythema. No pallor.  Psychiatric: She has a normal mood and affect. Her behavior is normal.  Nursing note and vitals reviewed.    ED Treatments / Results  Labs (all labs ordered are listed, but only abnormal results are displayed) Labs Reviewed  RAPID STREP SCREEN (NOT AT [facility])  CULTURE, GROUP A STREP [facility])    EKG  EKG Interpretation None       Radiology No results found.  Procedures Procedures (including critical care time)  Medications Ordered in ED Medications - No data to display   Initial Impression / Assessment and Plan / ED Course  I have reviewed the triage vital signs and the nursing notes.  Pertinent labs & imaging results that were available during my care of the patient were reviewed by me and considered in my medical decision making (see chart for details).     Patient complaining of symptoms of allergic rhinitis. Suspect pharyngitis to be 2/2 PND. Patient is afebrile. No concern for acute bacterial rhinosinusitis. Patient discharged with symptomatic treatment. Recommendation given for follow-up with primary care physician. Return precautions discussed and provided. Patient discharged in stable condition  with no unaddressed concerns.    Final Clinical Impressions(s) / ED Diagnoses   Final diagnoses:  Pharyngitis, unspecified etiology  Seasonal allergic rhinitis, unspecified trigger    New Prescriptions New Prescriptions   CETIRIZINE (ZYRTEC ALLERGY) 10 MG TABLET    Take 1 tablet (10 mg total) by mouth daily.   MAGIC MOUTHWASH W/LIDOCAINE SOLN    Take 10 mLs by mouth 4 (four) times daily as needed (sore throat). Gargle and swallow.     Antony Madura, PA-C 12/28/16 0543    Paula Libra, MD 12/28/16 [phone]

## 2016-12-30 LAB — CULTURE, GROUP A STREP (THRC)

## 2020-02-28 ENCOUNTER — Encounter (HOSPITAL_COMMUNITY): Payer: Self-pay | Admitting: Emergency Medicine

## 2020-02-28 ENCOUNTER — Other Ambulatory Visit: Payer: Self-pay

## 2020-02-28 ENCOUNTER — Emergency Department (HOSPITAL_COMMUNITY)
Admission: EM | Admit: 2020-02-28 | Discharge: 2020-02-28 | Disposition: A | Discharge status: Home / Self Care | Payer: Federal, State, Local not specified - PPO | Attending: Emergency Medicine | Admitting: Emergency Medicine

## 2020-02-28 ENCOUNTER — Emergency Department (HOSPITAL_COMMUNITY): Payer: Federal, State, Local not specified - PPO

## 2020-02-28 DIAGNOSIS — Y939 Activity, unspecified: Secondary | ICD-10-CM | POA: Diagnosis not present

## 2020-02-28 DIAGNOSIS — J45909 Unspecified asthma, uncomplicated: Secondary | ICD-10-CM | POA: Insufficient documentation

## 2020-02-28 DIAGNOSIS — S62397A Other fracture of fifth metacarpal bone, left hand, initial encounter for closed fracture: Secondary | ICD-10-CM | POA: Diagnosis not present

## 2020-02-28 DIAGNOSIS — Y999 Unspecified external cause status: Secondary | ICD-10-CM | POA: Diagnosis not present

## 2020-02-28 DIAGNOSIS — W2209XA Striking against other stationary object, initial encounter: Secondary | ICD-10-CM | POA: Insufficient documentation

## 2020-02-28 DIAGNOSIS — Y929 Unspecified place or not applicable: Secondary | ICD-10-CM | POA: Diagnosis not present

## 2020-02-28 DIAGNOSIS — S6992XA Unspecified injury of left wrist, hand and finger(s), initial encounter: Secondary | ICD-10-CM | POA: Diagnosis present

## 2020-02-28 DIAGNOSIS — Z79899 Other long term (current) drug therapy: Secondary | ICD-10-CM | POA: Diagnosis not present

## 2020-02-28 DIAGNOSIS — S62339A Displaced fracture of neck of unspecified metacarpal bone, initial encounter for closed fracture: Secondary | ICD-10-CM

## 2020-02-28 NOTE — ED Triage Notes (Signed)
Pt. Stated, I have a boxer frcture on  My left hand Its bee there since June 29, 21. Went to an [facility] on [address]. I need a hard cast they said.

## 2020-02-28 NOTE — ED Provider Notes (Signed)
[facility] EMERGENCY DEPARTMENT Provider Note   CSN: [id number] Arrival date & time: 02/28/20  1430     History Chief Complaint  Patient presents with  . Hand Injury    Jennifer Vance is a 24 y.o. female.  HPI   Patient is a 24 year old female who presents due to a fracture of the left hand.  Patient states 6 days ago she became angry and punched a wall with a closed left fist.  She experienced significant pain and swelling of the left hand and was then evaluated in urgent care 3 days later.  She was diagnosed with a boxer's fracture but was told that they did not "have anything that fit her left hand to brace it with" and was told to follow-up with [facility].  She does not currently have insurance or any funds.  She states she called [facility] and was told that they can set her up on a payment plan but she cannot make the initial payment and thus has not been evaluated.  She presents today with continued pain along the fourth and fifth metacarpals of the left hand.  Trace edema.  No numbness, tingling, weakness.     Past Medical History:  Diagnosis Date  . Asthma   . Common migraine with intractable migraine 10/13/2015    Patient Active Problem List   Diagnosis Date Noted  . Common migraine with intractable migraine 10/13/2015    No past surgical history on file.   OB History   No obstetric history on file.     Family History  Problem Relation Age of Onset  . Migraines Mother     Social History   Tobacco Use  . Smoking status: Never Smoker  . Smokeless tobacco: Never Used  Substance Use Topics  . Alcohol use: No    Alcohol/week: 0.0 standard drinks  . Drug use: No    Home Medications Prior to Admission medications   Medication Sig Start Date End Date Taking? Authorizing Provider  albuterol (PROVENTIL HFA;VENTOLIN HFA) 108 (90 Base) MCG/ACT inhaler Inhale 2 puffs into the lungs every 6 (six) hours as needed for wheezing or shortness of  breath.    [provider]  amoxicillin (AMOXIL) 875 MG tablet Take 1 tablet (875 mg total) by mouth 2 (two) times daily. 08/10/16   Deatra Canter, FNP  benzonatate (TESSALON) 100 MG capsule Take 1 capsule (100 mg total) by mouth every 8 (eight) hours. 08/10/16   Deatra Canter, FNP  cetirizine (ZYRTEC ALLERGY) 10 MG tablet Take 1 tablet (10 mg total) by mouth daily. 12/28/16   Antony Madura, PA-C  fluticasone (FLONASE) 50 MCG/ACT nasal spray Place 2 sprays into both nostrils daily as needed for allergies or rhinitis.    [provider]  Fluticasone-Salmeterol (ADVAIR) 250-50 MCG/DOSE AEPB Inhale 2 puffs into the lungs 2 (two) times daily as needed (for wheezing or shortness of breath).    [provider]  ketorolac (TORADOL) 10 MG tablet Take 1 tablet (10 mg total) by mouth every 8 (eight) hours as needed. Patient taking differently: Take 10 mg by mouth every 8 (eight) hours as needed for moderate pain.  10/13/15   York Spaniel, MD  magic mouthwash w/lidocaine SOLN Take 10 mLs by mouth 4 (four) times daily as needed (sore throat). Gargle and swallow. 12/28/16   Antony Madura, PA-C  metoCLOPramide (REGLAN) 5 MG tablet Take 1 tablet (5 mg total) by mouth every 6 (six) hours as needed for nausea.  12/04/15   Raeford Razor, MD  predniSONE (DELTASONE) 10 MG tablet 4 po qd x 2d 3 po qd x 2d 2 po qd x 2d then 1 po qd x 2d then stop 08/10/16   Deatra Canter, FNP  rizatriptan (MAXALT-MLT) 10 MG disintegrating tablet Take 1 tablet (10 mg total) by mouth as needed for migraine. May repeat in 2 hours if needed 12/04/15   Raeford Razor, MD  topiramate (TOPAMAX) 25 MG tablet Take 4 tablets (100 mg total) by mouth at bedtime. 12/15/15   Butch Penny, NP    Allergies    Other and Strawberry flavor  Review of Systems   Review of Systems  Musculoskeletal: Positive for arthralgias, joint swelling and myalgias.  Skin: Negative for color change and wound.  Neurological: Negative  for weakness and numbness.   Physical Exam Updated Vital Signs BP 112/75 (BP Location: Right Arm)   Pulse 66   Temp 98.3 F (36.8 C) (Oral)   Resp 16   LMP 02/26/2020   SpO2 100%   Physical Exam Vitals and nursing note reviewed.  Constitutional:      General: She is not in acute distress.    Appearance: She is well-developed.  HENT:     Head: Normocephalic and atraumatic.     Right Ear: External ear normal.     Left Ear: External ear normal.  Eyes:     General: No scleral icterus.       Right eye: No discharge.        Left eye: No discharge.     Conjunctiva/sclera: Conjunctivae normal.  Neck:     Trachea: No tracheal deviation.  Cardiovascular:     Rate and Rhythm: Normal rate.  Pulmonary:     Effort: Pulmonary effort is normal. No respiratory distress.     Breath sounds: No stridor.  Abdominal:     General: There is no distension.  Musculoskeletal:        General: Swelling, tenderness and signs of injury present. No deformity.     Cervical back: Neck supple.     Comments: Exquisite TTP noted overlying the fourth and fifth metacarpals of the left hand.  Trace edema noted.  Mild ecchymosis.  Full range of motion of the fingers of the left hand.  2+ radial pulse.  Neurovascularly intact in all fingers of the left hand.  Skin:    General: Skin is warm and dry.     Findings: Bruising present. No rash.  Neurological:     Mental Status: She is alert.     Cranial Nerves: Cranial nerve deficit: no gross deficits.    ED Results / Procedures / Treatments   Labs (all labs ordered are listed, but only abnormal results are displayed) Labs Reviewed - No data to display  EKG None  Radiology DG Hand Complete Left  Result Date: 02/28/2020 CLINICAL DATA:  Punched a glass wall EXAM: LEFT HAND - COMPLETE 3+ VIEW COMPARISON:  None. FINDINGS: Acute fracture involving the neck of the distal fifth metacarpal with mild volar angulation of distal fracture fragment. No subluxation. No  radiopaque foreign body. IMPRESSION: Acute mildly angulated distal fifth metacarpal fracture. Electronically Signed   By: Jasmine Pang M.D.   On: 02/28/2020 18:44   Procedures Procedures   Medications Ordered in ED Medications - No data to display  ED Course  I have reviewed the triage vital signs and the nursing notes.  Pertinent labs & imaging results that were available during my care  of the patient were reviewed by me and considered in my medical decision making (see chart for details).  Clinical Course as of Feb 28 1919  Fri Feb 28, 2020  1829 Acute mildly angulated distal fifth metacarpal fracture  DG Hand Complete Left [LJ]    Clinical Course User Index [LJ] Placido Sou, PA-C   MDM Rules/Calculators/A&P                          Patient is a 24 year old female who presents for reevaluation due to a boxer's fracture of the  left hand.  Reimaged the left hand which shows an acute mildly angulated distal fifth metacarpal fracture.  Patient has no funds or insurance at this time so social work was consulted.  Patient will follow-up with [facility] in 4 days and will then follow-up with [facility] orthopedic and sports medicine following that appointment.  We are going to place the patient in an ulnar gutter splint.  Recommended pain management with ibuprofen and Tylenol.  She knows she can always return to the emergency department for any new or worsening symptoms.  Her questions were answered and she was amicable at the time of discharge.  Her vital signs are stable.  Patient discharged to home/self care.  Condition at discharge: Stable  Note: Portions of this report may have been transcribed using voice recognition software. Every effort was made to ensure accuracy; however, inadvertent computerized transcription errors may be present.    Final Clinical Impression(s) / ED Diagnoses Final diagnoses:  Closed boxer's fracture, initial  encounter   Rx / DC Orders ED Discharge Orders    None       Placido Sou, PA-C 02/28/20 1929    Tilden Fossa, MD 02/28/20 1935

## 2020-02-28 NOTE — Discharge Instructions (Signed)
Please follow-up with [facility] as well as [place] [facility].  Their instructions below.  You can take Tylenol and ibuprofen for management of your pain.  You can always return to the emergency department if you develop new or worsening symptoms.  It was a pleasure to meet you.

## 2020-02-28 NOTE — Care Management (Addendum)
ED CM met with patient at bedside to discuss assistance with  transitional care planning.  Patient reports aging out of parents insurance last week.  Patient cannot afford to see her primary care, patient was seen at a non [facility] and noted to have a boxer fracture. She was seen and sent to [facility] as per patient.  She was not able to afford the initial visit fee at [facility], so she came to the ED.  CM Discussed [place] [facility] care community clinic's patient is agreeable. CM obtained permission to send information to the CM at the clinic and to assist with an appointment with [facility].  Updated Waldon Reining PA-C who is agreeable with plan.

## 2020-02-28 NOTE — Progress Notes (Signed)
Orthopedic Tech Progress Note Patient Details:  Marina Boerner 1996/07/04 [id number]  Ortho Devices Type of Ortho Device: Ulna gutter splint Ortho Device/Splint Location: Left Upper Extremity Ortho Device/Splint Interventions: Ordered, Application   Post Interventions Patient Tolerated: Well Instructions Provided: Adjustment of device, Care of device, Poper ambulation with device   Grady Mohabir Clarene Reamer 02/28/2020, 7:59 PM

## 2020-03-04 ENCOUNTER — Ambulatory Visit: Payer: Federal, State, Local not specified - PPO | Admitting: Orthopedic Surgery

## 2020-04-15 ENCOUNTER — Ambulatory Visit: Payer: Self-pay | Attending: Family Medicine | Admitting: Family Medicine

## 2020-04-15 ENCOUNTER — Other Ambulatory Visit: Payer: Self-pay

## 2020-04-15 DIAGNOSIS — S62339A Displaced fracture of neck of unspecified metacarpal bone, initial encounter for closed fracture: Secondary | ICD-10-CM

## 2020-04-15 NOTE — Progress Notes (Signed)
Patient was seen in ED for broken hand.

## 2021-09-15 ENCOUNTER — Encounter (HOSPITAL_BASED_OUTPATIENT_CLINIC_OR_DEPARTMENT_OTHER): Payer: Self-pay | Admitting: Emergency Medicine

## 2021-09-15 ENCOUNTER — Other Ambulatory Visit: Payer: Self-pay

## 2021-09-15 ENCOUNTER — Emergency Department (HOSPITAL_BASED_OUTPATIENT_CLINIC_OR_DEPARTMENT_OTHER)
Admission: EM | Admit: 2021-09-15 | Discharge: 2021-09-15 | Disposition: A | Discharge status: Home / Self Care | Payer: Self-pay | Attending: Emergency Medicine | Admitting: Emergency Medicine

## 2021-09-15 ENCOUNTER — Emergency Department (HOSPITAL_BASED_OUTPATIENT_CLINIC_OR_DEPARTMENT_OTHER): Payer: Self-pay | Admitting: Radiology

## 2021-09-15 DIAGNOSIS — J45909 Unspecified asthma, uncomplicated: Secondary | ICD-10-CM | POA: Insufficient documentation

## 2021-09-15 DIAGNOSIS — Z7952 Long term (current) use of systemic steroids: Secondary | ICD-10-CM | POA: Insufficient documentation

## 2021-09-15 DIAGNOSIS — R0789 Other chest pain: Secondary | ICD-10-CM

## 2021-09-15 DIAGNOSIS — Z7951 Long term (current) use of inhaled steroids: Secondary | ICD-10-CM | POA: Insufficient documentation

## 2021-09-15 DIAGNOSIS — J209 Acute bronchitis, unspecified: Secondary | ICD-10-CM | POA: Insufficient documentation

## 2021-09-15 DIAGNOSIS — Z79899 Other long term (current) drug therapy: Secondary | ICD-10-CM | POA: Insufficient documentation

## 2021-09-15 LAB — CBC
HCT: 34.5 % — ABNORMAL LOW (ref 36.0–46.0)
Hemoglobin: 10.5 g/dL — ABNORMAL LOW (ref 12.0–15.0)
MCH: 23.9 pg — ABNORMAL LOW (ref 26.0–34.0)
MCHC: 30.4 g/dL (ref 30.0–36.0)
MCV: 78.6 fL — ABNORMAL LOW (ref 80.0–100.0)
Platelets: 221 10*3/uL (ref 150–400)
RBC: 4.39 MIL/uL (ref 3.87–5.11)
RDW: 14 % (ref 11.5–15.5)
WBC: 4.3 10*3/uL (ref 4.0–10.5)
nRBC: 0 % (ref 0.0–0.2)

## 2021-09-15 LAB — TROPONIN I (HIGH SENSITIVITY): Troponin I (High Sensitivity): <2 ng/L (ref <18)

## 2021-09-15 LAB — BASIC METABOLIC PANEL
Anion gap: 9 (ref 5–15)
BUN: 9 mg/dL (ref 6–20)
CO2: 22 mmol/L (ref 22–32)
Calcium: 8.8 mg/dL — ABNORMAL LOW (ref 8.9–10.3)
Chloride: 107 mmol/L (ref 98–111)
Creatinine, Ser: 0.7 mg/dL (ref 0.44–1.00)
GFR, Estimated: >60 mL/min (ref >60)
Glucose, Bld: 98 mg/dL (ref 70–99)
Potassium: 3.5 mmol/L (ref 3.5–5.1)
Sodium: 138 mmol/L (ref 135–145)

## 2021-09-15 LAB — PREGNANCY, URINE: Preg Test, Ur: NEGATIVE

## 2021-09-15 MED ORDER — AZITHROMYCIN 250 MG PO TABS
500.0000 mg | ORAL_TABLET | Freq: Once | ORAL | Status: AC
  Administered 2021-09-15: 500 mg via ORAL
  Filled 2021-09-15: qty 2

## 2021-09-15 MED ORDER — PREDNISONE 10 MG PO TABS: 20.0000 mg | ORAL_TABLET | Freq: Two times a day (BID) | ORAL | 0 refills | Status: AC

## 2021-09-15 MED ORDER — ALBUTEROL SULFATE HFA 108 (90 BASE) MCG/ACT IN AERS
2.0000 | INHALATION_SPRAY | Freq: Once | RESPIRATORY_TRACT | Status: AC
  Administered 2021-09-15: 2 via RESPIRATORY_TRACT
  Filled 2021-09-15: qty 6.7

## 2021-09-15 MED ORDER — AZITHROMYCIN 250 MG PO TABS: 250.0000 mg | ORAL_TABLET | Freq: Every day | ORAL | 0 refills | Status: AC

## 2021-09-15 MED ORDER — PREDNISONE 20 MG PO TABS
20.0000 mg | ORAL_TABLET | Freq: Once | ORAL | Status: AC
  Administered 2021-09-15: 20 mg via ORAL
  Filled 2021-09-15: qty 1

## 2021-09-15 NOTE — ED Provider Notes (Signed)
[facility] EMERGENCY DEPT Provider Note   CSN: [id number] Arrival date & time: 09/15/21  0111     History  Chief Complaint  Patient presents with   Chest Pain    Jennifer Vance is a 26 y.o. female.  Patient is a 26 year old female with past medical history of asthma presenting with complaints of chest pain.  She tells me she has had a persistent cough for the past 2 months and is now experiencing discomfort to the left chest.  This has been worsening over the past 3 days.  She denies any fevers or chills.  She denies any leg pain or swelling.  She does have history of asthma, however has not required medications in several years.  The history is provided by the patient.  Chest Pain Pain location:  L chest Pain quality: sharp   Pain radiates to:  Does not radiate Pain severity:  Moderate Onset quality:  Sudden Duration:  3 days Timing:  Constant Progression:  Unchanged Chronicity:  New Relieved by:  Nothing Worsened by:  Deep breathing and movement Ineffective treatments:  None tried     Home Medications Prior to Admission medications   Medication Sig Start Date End Date Taking? Authorizing Provider  albuterol (PROVENTIL HFA;VENTOLIN HFA) 108 (90 Base) MCG/ACT inhaler Inhale 2 puffs into the lungs every 6 (six) hours as needed for wheezing or shortness of breath. Patient not taking: Reported on 04/15/2020    [provider]  amoxicillin (AMOXIL) 875 MG tablet Take 1 tablet (875 mg total) by mouth 2 (two) times daily. Patient not taking: Reported on 04/15/2020 08/10/16   Deatra Canter, FNP  benzonatate (TESSALON) 100 MG capsule Take 1 capsule (100 mg total) by mouth every 8 (eight) hours. Patient not taking: Reported on 04/15/2020 08/10/16   Deatra Canter, FNP  cetirizine (ZYRTEC ALLERGY) 10 MG tablet Take 1 tablet (10 mg total) by mouth daily. Patient not taking: Reported on 04/15/2020 12/28/16   Antony Madura, PA-C  fluticasone [facility]) 50  MCG/ACT nasal spray Place 2 sprays into both nostrils daily as needed for allergies or rhinitis. Patient not taking: Reported on 04/15/2020    [provider]  Fluticasone-Salmeterol (ADVAIR) 250-50 MCG/DOSE AEPB Inhale 2 puffs into the lungs 2 (two) times daily as needed (for wheezing or shortness of breath). Patient not taking: Reported on 04/15/2020    [provider]  ketorolac (TORADOL) 10 MG tablet Take 1 tablet (10 mg total) by mouth every 8 (eight) hours as needed. Patient not taking: Reported on 04/15/2020 10/13/15   York Spaniel, MD  magic mouthwash w/lidocaine SOLN Take 10 mLs by mouth 4 (four) times daily as needed (sore throat). Gargle and swallow. Patient not taking: Reported on 04/15/2020 12/28/16   Antony Madura, PA-C  metoCLOPramide (REGLAN) 5 MG tablet Take 1 tablet (5 mg total) by mouth every 6 (six) hours as needed for nausea. Patient not taking: Reported on 04/15/2020 12/04/15   Raeford Razor, MD  predniSONE (DELTASONE) 10 MG tablet 4 po qd x 2d 3 po qd x 2d 2 po qd x 2d then 1 po qd x 2d then stop Patient not taking: Reported on 04/15/2020 08/10/16   Deatra Canter, FNP  rizatriptan (MAXALT-MLT) 10 MG disintegrating tablet Take 1 tablet (10 mg total) by mouth as needed for migraine. May repeat in 2 hours if needed Patient not taking: Reported on 04/15/2020 12/04/15   Raeford Razor, MD  topiramate (TOPAMAX) 25 MG tablet Take 4 tablets (100  mg total) by mouth at bedtime. Patient not taking: Reported on 04/15/2020 12/15/15   Butch PennyMillikan, Megan, NP      Allergies    Other and Strawberry flavor    Review of Systems   Review of Systems  Cardiovascular:  Positive for chest pain.  All other systems reviewed and are negative.  Physical Exam Updated Vital Signs BP 113/80 (BP Location: Right Arm)    Pulse 77    Temp 98.4 F (36.9 C) (Oral)    Resp 18    Wt 55.2 kg    LMP 09/14/2021 (Exact Date)    SpO2 99%    BMI 17.95 kg/m  Physical Exam Vitals and nursing note  reviewed.  Constitutional:      General: She is not in acute distress.    Appearance: She is well-developed. She is not diaphoretic.  HENT:     Head: Normocephalic and atraumatic.  Cardiovascular:     Rate and Rhythm: Normal rate and regular rhythm.     Heart sounds: No murmur heard.   No friction rub. No gallop.  Pulmonary:     Effort: Pulmonary effort is normal. No respiratory distress.     Breath sounds: Normal breath sounds. No wheezing.  Abdominal:     General: Bowel sounds are normal. There is no distension.     Palpations: Abdomen is soft.     Tenderness: There is no abdominal tenderness.  Musculoskeletal:        General: Normal range of motion.     Cervical back: Normal range of motion and neck supple.     Right lower leg: No tenderness. No edema.     Left lower leg: No tenderness. No edema.  Skin:    General: Skin is warm and dry.  Neurological:     General: No focal deficit present.     Mental Status: She is alert and oriented to person, place, and time.    ED Results / Procedures / Treatments   Labs (all labs ordered are listed, but only abnormal results are displayed) Labs Reviewed  CBC - Abnormal; Notable for the following components:      Result Value   Hemoglobin 10.5 (*)    HCT 34.5 (*)    MCV 78.6 (*)    MCH 23.9 (*)    All other components within normal limits  PREGNANCY, URINE  BASIC METABOLIC PANEL  TROPONIN I (HIGH SENSITIVITY)    EKG EKG Interpretation  Date/Time:  Wednesday September 15 2021 01:19:53 EST Ventricular Rate:  90 PR Interval:  146 QRS Duration: 76 QT Interval:  358 QTC Calculation: 437 R Axis:   67 Text Interpretation: Normal sinus rhythm with sinus arrhythmia Normal ECG When compared with ECG of 04-Dec-2015 09:06, No significant change was found Confirmed by Geoffery LyonseLo, Avantika Shere ([id number]) on 09/15/2021 2:22:28 AM  Radiology DG Chest 2 View  Result Date: 09/15/2021 CLINICAL DATA:  Chest pain EXAM: CHEST - 2 VIEW COMPARISON:   12/04/2015 FINDINGS: The heart size and mediastinal contours are within normal limits. Both lungs are clear. The visualized skeletal structures are unremarkable. IMPRESSION: No active cardiopulmonary disease. Electronically Signed   By: Alcide CleverMark  Lukens M.D.   On: 09/15/2021 02:01    Procedures Procedures    Medications Ordered in ED Medications - No data to display  ED Course/ Medical Decision Making/ A&P  This patient presents to the ED for concern of chest pain and cough, this involves an extensive number of treatment options, and is a complaint  that carries with it a high risk of complications and morbidity.  The differential diagnosis includes pneumonia, asthma exacerbation, acute coronary syndrome   Co morbidities that complicate the patient evaluation  None   Additional history obtained:  No additional historians and no external records are needed   Lab Tests:  I Ordered, and personally interpreted labs.  The pertinent results include: CBC, basic metabolic panel, and troponin.  These are all unremarkable.   Imaging Studies ordered:  I ordered imaging studies including chest x-ray I independently visualized and interpreted imaging which showed no acute process I agree with the radiologist interpretation   Cardiac Monitoring:  Not performed   Medicines ordered and prescription drug management:  I ordered medication including prednisone, albuterol, and Zithromax for asthma exacerbation/bronchitis Reevaluation of the patient after these medicines showed that the patient stayed the same I have reviewed the patients home medicines and have made adjustments as needed   Test Considered:  No other test considered were ordered   Critical Interventions:  None   Consultations Obtained:  No consultations requested or obtained   Problem List / ED Course:  Patient presenting with a several week history of intermittent cough, now experiencing pain to the left  chest.  I suspect the chest pain is musculoskeletal in nature as her chest x-ray is clear and work-up unremarkable.  Due to the prolonged nature of her symptoms, I feel as though it is reasonable to prescribe antibiotics, steroids, and an albuterol inhaler.  She is to follow-up as needed if not improving.   Reevaluation:  After the interventions noted above, I reevaluated the patient and found that they have :stayed the same   Social Determinants of Health:  None   Dispostion:  After consideration of the diagnostic results and the patients response to treatment, I feel that the patent would benefit from outpatient treatment with steroids, bronchodilators, and antibiotics.    Final Clinical Impression(s) / ED Diagnoses Final diagnoses:  None    Rx / DC Orders ED Discharge Orders     None         Geoffery Lyons, MD 09/15/21 [phone]

## 2021-09-15 NOTE — ED Triage Notes (Signed)
Pt presents for chest pain (mid and L chest) x3 days related to recent increase in anxiety. CP worse at night, feels like pressure, endorses dizziness. Also concerned that she has had nonproductive cough since October.  No OTC medications taken. Denies heart history. Nonsmoker. Denies dizziness, N/V/D.

## 2021-09-15 NOTE — Discharge Instructions (Signed)
Begin taking Zithromax and prednisone as prescribed.  Use the albuterol inhaler, 2 puffs every 4 hours as needed for wheezing or difficulty breathing.  Return to the emergency department if symptoms significantly worsen or change.

## 2022-05-01 ENCOUNTER — Ambulatory Visit (HOSPITAL_COMMUNITY)
Admission: EM | Admit: 2022-05-01 | Discharge: 2022-05-01 | Disposition: A | Discharge status: Home / Self Care | Payer: Self-pay | Attending: Physician Assistant | Admitting: Physician Assistant

## 2022-05-01 ENCOUNTER — Ambulatory Visit (INDEPENDENT_AMBULATORY_CARE_PROVIDER_SITE_OTHER): Payer: Self-pay

## 2022-05-01 ENCOUNTER — Encounter (HOSPITAL_COMMUNITY): Payer: Self-pay

## 2022-05-01 DIAGNOSIS — S60031A Contusion of right middle finger without damage to nail, initial encounter: Secondary | ICD-10-CM

## 2022-05-01 DIAGNOSIS — M79641 Pain in right hand: Secondary | ICD-10-CM

## 2022-05-01 DIAGNOSIS — M79644 Pain in right finger(s): Secondary | ICD-10-CM

## 2022-05-01 DIAGNOSIS — M7701 Medial epicondylitis, right elbow: Secondary | ICD-10-CM

## 2022-05-01 MED ORDER — IBUPROFEN 600 MG PO TABS: 600.0000 mg | ORAL_TABLET | Freq: Three times a day (TID) | ORAL | 0 refills | Status: AC
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
## 2022-05-01 NOTE — Discharge Instructions (Signed)
Advised to use ice to the area frequently to help reduce pain and swelling. Advised to use a tennis elbow support to help decrease the irritation of the medial epicondylar area while working. Advised take ibuprofen 600 mg every 8 hours with food to help reduce pain and discomfort. Advised to follow-up PCP or return to urgent care if symptoms fail to improve.
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
## 2023-06-10 IMAGING — DX DG CHEST 2V
2 series · 2 of 2 positions shown · non-contrast
Comparison: 12/04/2015

CLINICAL DATA: Chest pain

EXAM:
CHEST - 2 VIEW

[chest pa]
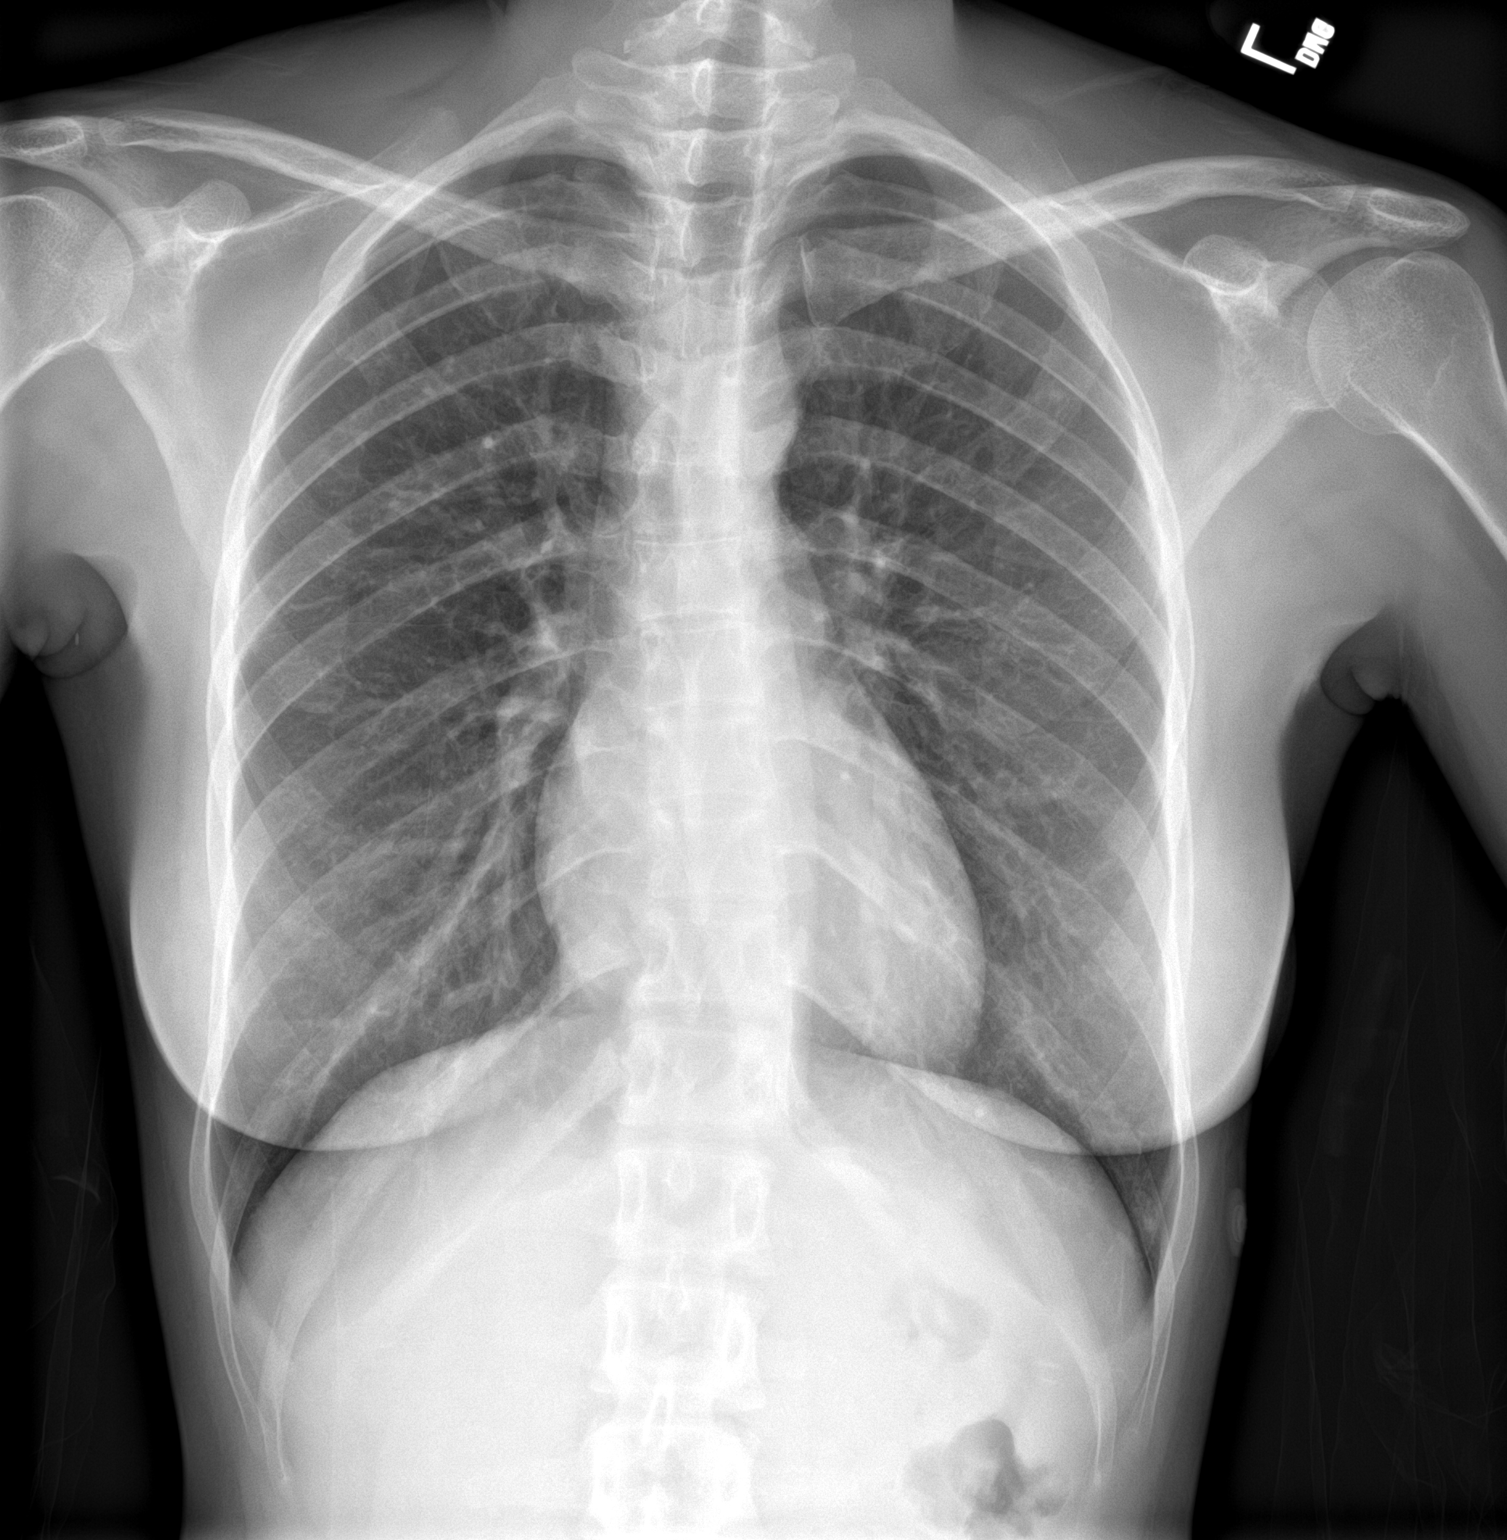

[chest lat]
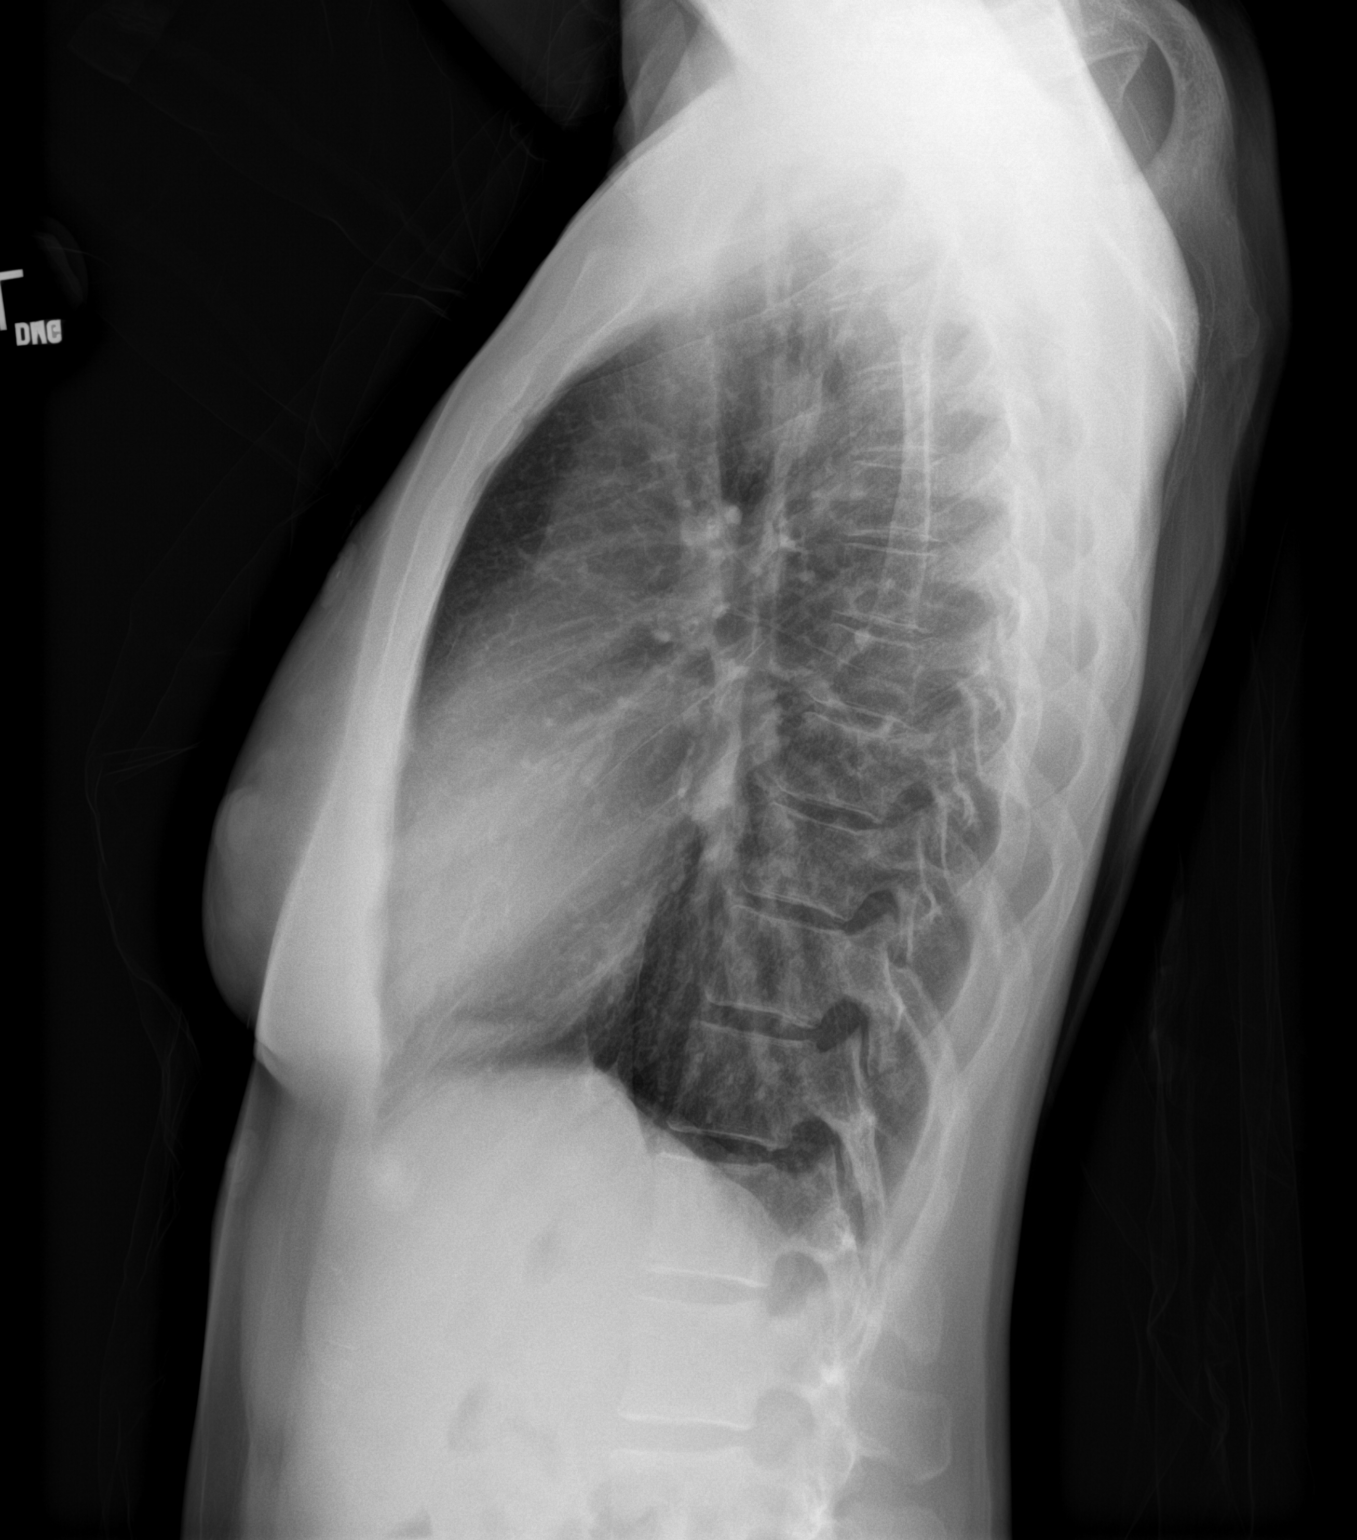

[2 of 2 positions shown; findings below may reference images not displayed]

FINDINGS: The heart size and mediastinal contours are within normal limits.
Both lungs are clear. The visualized skeletal structures are
unremarkable.
IMPRESSION: No active cardiopulmonary disease.
